# Patient Record
Sex: Female | Born: 1980 | Race: White | Hispanic: Yes | Marital: Married | Smoking: Never smoker
Health system: Southern US, Community
[De-identification: ages and names within clinical notes are randomized; demographics above are authoritative.]

## PROBLEM LIST (undated history)

## (undated) ENCOUNTER — Inpatient Hospital Stay (HOSPITAL_COMMUNITY): Payer: Self-pay

## (undated) DIAGNOSIS — B977 Papillomavirus as the cause of diseases classified elsewhere: Secondary | ICD-10-CM

## (undated) DIAGNOSIS — S6291XA Unspecified fracture of right wrist and hand, initial encounter for closed fracture: Secondary | ICD-10-CM

## (undated) DIAGNOSIS — B999 Unspecified infectious disease: Secondary | ICD-10-CM

## (undated) DIAGNOSIS — N200 Calculus of kidney: Secondary | ICD-10-CM

## (undated) DIAGNOSIS — D649 Anemia, unspecified: Secondary | ICD-10-CM

## (undated) DIAGNOSIS — IMO0002 Reserved for concepts with insufficient information to code with codable children: Secondary | ICD-10-CM

## (undated) DIAGNOSIS — R51 Headache: Secondary | ICD-10-CM

## (undated) HISTORY — PX: COLPOSCOPY: SHX161

## (undated) HISTORY — PX: CHOLECYSTECTOMY: SHX55

## (undated) HISTORY — DX: Headache: R51

## (undated) HISTORY — DX: Calculus of kidney: N20.0

## (undated) HISTORY — PX: ABDOMINOPLASTY: SUR9

## (undated) HISTORY — DX: Unspecified fracture of right wrist and hand, initial encounter for closed fracture: S62.91XA

---
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
## 2000-01-23 ENCOUNTER — Encounter: Payer: Self-pay | Admitting: *Deleted

## 2000-01-23 ENCOUNTER — Ambulatory Visit (HOSPITAL_COMMUNITY): Admission: RE | Admit: 2000-01-23 | Discharge: 2000-01-23 | Payer: Self-pay | Admitting: *Deleted

## 2000-04-07 ENCOUNTER — Inpatient Hospital Stay (HOSPITAL_COMMUNITY): Admission: AD | Admit: 2000-04-07 | Discharge: 2000-04-12 | Payer: Self-pay | Admitting: *Deleted

## 2000-04-07 ENCOUNTER — Encounter: Payer: Self-pay | Admitting: *Deleted

## 2000-04-07 ENCOUNTER — Encounter (INDEPENDENT_AMBULATORY_CARE_PROVIDER_SITE_OTHER): Payer: Self-pay

## 2000-04-08 ENCOUNTER — Encounter: Payer: Self-pay | Admitting: *Deleted

## 2000-04-08 ENCOUNTER — Encounter (HOSPITAL_BASED_OUTPATIENT_CLINIC_OR_DEPARTMENT_OTHER): Payer: Self-pay | Admitting: General Surgery

## 2000-06-19 ENCOUNTER — Encounter: Payer: Self-pay | Admitting: *Deleted

## 2000-06-19 ENCOUNTER — Observation Stay (HOSPITAL_COMMUNITY): Admission: AD | Admit: 2000-06-19 | Discharge: 2000-06-20 | Payer: Self-pay | Admitting: *Deleted

## 2000-07-28 ENCOUNTER — Observation Stay (HOSPITAL_COMMUNITY): Admission: AD | Admit: 2000-07-28 | Discharge: 2000-07-28 | Payer: Self-pay | Admitting: *Deleted

## 2000-07-31 ENCOUNTER — Inpatient Hospital Stay (HOSPITAL_COMMUNITY): Admission: AD | Admit: 2000-07-31 | Discharge: 2000-08-03 | Payer: Self-pay | Admitting: *Deleted

## 2004-04-24 ENCOUNTER — Other Ambulatory Visit: Admission: RE | Admit: 2004-04-24 | Discharge: 2004-04-24 | Payer: Self-pay | Admitting: Obstetrics and Gynecology

## 2004-10-16 ENCOUNTER — Inpatient Hospital Stay (HOSPITAL_COMMUNITY): Admission: AD | Admit: 2004-10-16 | Discharge: 2004-10-19 | Payer: Self-pay | Admitting: Obstetrics and Gynecology

## 2004-11-27 ENCOUNTER — Other Ambulatory Visit: Admission: RE | Admit: 2004-11-27 | Discharge: 2004-11-27 | Payer: Self-pay | Admitting: Obstetrics and Gynecology

## 2006-05-08 ENCOUNTER — Other Ambulatory Visit: Admission: RE | Admit: 2006-05-08 | Discharge: 2006-05-08 | Payer: Self-pay | Admitting: Obstetrics and Gynecology

## 2006-10-11 ENCOUNTER — Inpatient Hospital Stay (HOSPITAL_COMMUNITY): Admission: AD | Admit: 2006-10-11 | Discharge: 2006-10-11 | Payer: Self-pay | Admitting: Gynecology

## 2007-06-13 ENCOUNTER — Inpatient Hospital Stay (HOSPITAL_COMMUNITY): Admission: AD | Admit: 2007-06-13 | Discharge: 2007-06-13 | Payer: Self-pay | Admitting: Gynecology

## 2007-06-18 ENCOUNTER — Encounter (INDEPENDENT_AMBULATORY_CARE_PROVIDER_SITE_OTHER): Payer: Self-pay | Admitting: Obstetrics and Gynecology

## 2007-06-18 ENCOUNTER — Inpatient Hospital Stay (HOSPITAL_COMMUNITY): Admission: AD | Admit: 2007-06-18 | Discharge: 2007-06-18 | Payer: Self-pay | Admitting: Obstetrics and Gynecology

## 2007-11-26 DIAGNOSIS — N200 Calculus of kidney: Secondary | ICD-10-CM

## 2007-11-26 DIAGNOSIS — B999 Unspecified infectious disease: Secondary | ICD-10-CM

## 2007-11-26 HISTORY — DX: Unspecified infectious disease: B99.9

## 2007-11-26 HISTORY — DX: Calculus of kidney: N20.0

## 2008-07-06 ENCOUNTER — Emergency Department (HOSPITAL_COMMUNITY): Admission: EM | Admit: 2008-07-06 | Discharge: 2008-07-06 | Payer: Self-pay | Admitting: Emergency Medicine

## 2008-10-21 ENCOUNTER — Emergency Department (HOSPITAL_COMMUNITY): Admission: EM | Admit: 2008-10-21 | Discharge: 2008-10-21 | Payer: Self-pay | Admitting: Emergency Medicine

## 2009-07-14 ENCOUNTER — Emergency Department (HOSPITAL_COMMUNITY): Admission: EM | Admit: 2009-07-14 | Discharge: 2009-07-15 | Payer: Self-pay | Admitting: Emergency Medicine

## 2009-08-02 ENCOUNTER — Emergency Department (HOSPITAL_COMMUNITY): Admission: EM | Admit: 2009-08-02 | Discharge: 2009-08-02 | Payer: Self-pay | Admitting: Emergency Medicine

## 2010-11-25 DIAGNOSIS — S6291XA Unspecified fracture of right wrist and hand, initial encounter for closed fracture: Secondary | ICD-10-CM

## 2010-11-25 HISTORY — DX: Unspecified fracture of right wrist and hand, initial encounter for closed fracture: S62.91XA

## 2010-12-16 ENCOUNTER — Encounter: Payer: Self-pay | Admitting: Urology

## 2011-03-01 LAB — URINE MICROSCOPIC-ADD ON

## 2011-03-01 LAB — URINALYSIS, ROUTINE W REFLEX MICROSCOPIC
Glucose, UA: NEGATIVE mg/dL
Ketones, ur: NEGATIVE mg/dL
Protein, ur: 30 mg/dL — AB

## 2011-03-01 LAB — BASIC METABOLIC PANEL
BUN: 17 mg/dL (ref 6–23)
Chloride: 106 mEq/L (ref 96–112)
Glucose, Bld: 88 mg/dL (ref 70–99)
Potassium: 3.6 mEq/L (ref 3.5–5.1)

## 2011-03-01 LAB — CBC
HCT: 39.4 % (ref 36.0–46.0)
MCV: 87.1 fL (ref 78.0–100.0)
Platelets: 264 10*3/uL (ref 150–400)
WBC: 7.7 10*3/uL (ref 4.0–10.5)

## 2011-03-01 LAB — DIFFERENTIAL
Eosinophils Absolute: 0.1 10*3/uL (ref 0.0–0.7)
Eosinophils Relative: 1 % (ref 0–5)
Lymphs Abs: 1.8 10*3/uL (ref 0.7–4.0)

## 2011-03-01 LAB — POCT PREGNANCY, URINE: Preg Test, Ur: NEGATIVE

## 2011-03-01 LAB — URINE CULTURE

## 2011-03-02 LAB — POCT I-STAT, CHEM 8
BUN: 19 mg/dL (ref 6–23)
Creatinine, Ser: 1.1 mg/dL (ref 0.4–1.2)
Hemoglobin: 12.9 g/dL (ref 12.0–15.0)
Potassium: 3.8 mEq/L (ref 3.5–5.1)
Sodium: 140 mEq/L (ref 135–145)

## 2011-03-02 LAB — URINE MICROSCOPIC-ADD ON

## 2011-03-02 LAB — CBC
RBC: 4.22 MIL/uL (ref 3.87–5.11)
WBC: 10.6 10*3/uL — ABNORMAL HIGH (ref 4.0–10.5)

## 2011-03-02 LAB — DIFFERENTIAL
Lymphs Abs: 2.1 10*3/uL (ref 0.7–4.0)
Monocytes Relative: 5 % (ref 3–12)
Neutro Abs: 7.8 10*3/uL — ABNORMAL HIGH (ref 1.7–7.7)
Neutrophils Relative %: 74 % (ref 43–77)

## 2011-03-02 LAB — URINE CULTURE: Colony Count: NO GROWTH

## 2011-03-02 LAB — URINALYSIS, ROUTINE W REFLEX MICROSCOPIC
Glucose, UA: NEGATIVE mg/dL
Ketones, ur: NEGATIVE mg/dL
Protein, ur: NEGATIVE mg/dL

## 2011-04-12 NOTE — Discharge Summary (Signed)
[facility]  Patient:    [name] [name], [name] [name]                        MRN: [id number] Adm. Date:  04/07/00 Disc. Date: 04/12/00 Attending:  Deniece Ree, M.D.                           Discharge Summary  DISCHARGE DIAGNOSES:          1. Intrauterine pregnancy at approximately [redacted] weeks                                  gestation.                               2. Acute appendicitis.  PROCEDURE:                    Exploratory laparotomy with appendectomy.  HISTORY OF PRESENT ILLNESS:   The patient is an 30 year old primigravid approximately [redacted] weeks gestation who was admitted with premature labor.  The patients vital signs was within normal limits with a temperature of 98.4, pulse  102, respirations 24, and a blood pressure of 117/67.  The patients only complaint was severe abdominal pain which an ultrasound was done and only revealed that there was no cervix present.  On physical examination done by me, revealed that the cervix was indeed present and it was not dilated.  On further evaluation, it was felt that the patient had possibly appendicits and at which time Luisa Hart L. Lurene Shadow, M.D. was consulted who evaluated the patient and obtained further evaluation. After his consultation, it was felt that the patient possibly did have an appendicitis.  HOSPITAL COURSE:              She was operated on on May 27.  The patient underwent this surgical procedure without any problems.  She did not have any obstetrical  problems during this period of time and was discharged on May 19.  DISCHARGE INSTRUCTIONS:       She was instructed on the possible complications nd care following this type of surgery.  FOLLOW-UP:                    She was told to follow up with me for obstetrical  care and also with Luisa Hart L. Lurene Shadow, M.D. as they had planned. DD:  04/30/00 TD:  05/01/00 Job: [id number]

## 2011-04-12 NOTE — Op Note (Signed)
[facility]  Patient:    [name] [name], [name] [name]                         MRN: [id number] Proc. Date: 04/09/00 Adm. Date:  [id number] Attending:  Deniece Ree CC:         Mardene Celeste. Lurene Shadow, M.D. x 2             Deniece Ree, M.D.                           Operative Report  PREOPERATIVE DIAGNOSIS:       Appendicitis.  POSTOPERATIVE DIAGNOSIS:      Right lower quadrant pain, undetermined etiology.  OPERATION:                    Exploratory laparotomy and appendectomy.  SURGEON:                      Mardene Celeste. Lurene Shadow, M.D.  ASSISTANT:                    Marnee Spring. Wiliam Ke, M.D.  ANESTHESIA:                   General anesthesia.  ESTIMATED BLOOD LOSS:  INDICATIONS:                  The patient is an 30 year old female with 25 weeks gravid uterus who presented with nausea and vomiting and right-sided abdominal pain.  She had an ultrasound of the abdomen which showed no evidence of cholelithiasis.  Pelvic organs were obscured because of bowel gas.  She subsequently had a CT scan of the abdomen which showed retrocecal inflammation consistent with appendicitis.  We first tried treating her conservatively with antibiotics, but because of her persistent pain, she is brought to the operating room for exploration.  DESCRIPTION OF PROCEDURE:     Following the induction of anesthesia with the patient positioned supinely and the abdomen prepped and draped to be included in a sterile operative field, the right lower quadrant incision was deepened through the skin and subcutaneous tissues with splitting of the flank muscles in the direction of their fibers down to the peritoneum.  The peritoneum was grasped and entered. The cecum was mobilized.  The appendix appeared to be grossly normal.  The mesoappendix was divided between clamps and secured with ties of 2-0 silk.  The  base of the appendix was doubly ligated with 0 chromic and 2-0 silk.  The appendix was  then amputated and the appendiceal stump cauterized.  Additional exploration was carried out to make sure there were no cecal diverticula and there were none. The distal ileum was mobilized for approximately 25 inches showing no evidence f Meckels diverticula or any Crohns.  Palpation in the retroperitoneum did not reveal any inflammatory phlegmon whatsoever.  In order not to further disturb the uterus, the procedure was ended after the sponge, instrument, and sharp counts ere verified.  The peritoneum was closed with a running suture of 0 chromic catgut.  Internal oblique and external oblique muscles closed with 0 chromic sutures. The subcutaneous tissues were closed with 3-0 Vicryl.  The skin was closed with a 4-0 Monocryl.  The wound was reinforced with Steri-Strips and sterile dressings applied.  Anesthetic reversed and the patient removed from the operating room to the recovery room in stable condition  having tolerated the procedure well. DD:  04/09/00 TD:  04/10/00 Job: [id number]

## 2011-04-12 NOTE — Discharge Summary (Signed)
NAMEBRETTANY, [name] [name]              ACCOUNT NO.:  [id number]   MEDICAL RECORD NO.:  [id number]          PATIENT TYPE:  INP   LOCATION:  9117                          FACILITY:  [facility]   PHYSICIAN:  James A. Ashley Royalty, M.D.DATE OF BIRTH:  1980-12-28   DATE OF ADMISSION:  10/16/2004  DATE OF DISCHARGE:  10/19/2004                                 DISCHARGE SUMMARY   DISCHARGE DIAGNOSES:  1.  Intrauterine pregnancy at 39 weeks 4 days gestation, delivered.  2.  Premature rupture of membranes.  3.  Term birth living child.   OPERATIONS AND SPECIAL PROCEDURES:  OB delivery with episiotomy,  episiorrhaphy.   CONSULTATIONS:  None.   DISCHARGE MEDICATIONS:  Motrin.   HISTORY AND PHYSICAL:  This is a 31 year old gravida 2 para 1 at 39 weeks 4  days gestation.  Prenatal care was complicated only by prominent fetal  bladder on ultrasound.  She was evaluated at [facility] and all was felt  to be within normal limits.  The patient presented on the day of admission  complaining of ruptured membranes since 6 p.m.  Initial cervical examination  in maternity admissions revealed the cervix to be 3 cm dilated, quite thick,  -1 station, and vertex presentation.   HOSPITAL COURSE:  The patient was admitted to [facility]  [place].  Admission laboratory studies were drawn.  Pitocin induction was  begun on October 17, 2004.  The patient went on to deliver that day.  The  infant was a 7-pound 11-ounce female, Apgars 9 at one minute and 9 at five  minutes, sent to newborn nursery.  Delivery was accomplished by Dr. Lonell Face from a +3 station occiput anterior with a vacuum extractor.  The  indication was terminal bradycardia.  The placenta and membranes were  intact.  Delivery was accomplished over a midline episiotomy which was  repaired without difficulty.  The patient's postpartum course was benign.  She was discharged home on postpartum day #2 afebrile and in satisfactory   condition.   DISPOSITION:  The patient is to return to [facility]  in 4-6 weeks for postpartum evaluation.      JAM/MEDQ  D:  11/15/2004  T:  11/15/2004  Job:  [id number]

## 2011-08-23 LAB — URINALYSIS, ROUTINE W REFLEX MICROSCOPIC
Bilirubin Urine: NEGATIVE
Glucose, UA: NEGATIVE
Ketones, ur: NEGATIVE
Protein, ur: 30 — AB

## 2011-08-23 LAB — CBC
Hemoglobin: 11.4 — ABNORMAL LOW
MCHC: 33.7
RBC: 4
WBC: 6.2

## 2011-08-23 LAB — DIFFERENTIAL
Lymphocytes Relative: 18
Lymphs Abs: 1.1
Monocytes Absolute: 0.4
Monocytes Relative: 7
Neutro Abs: 4.7
Neutrophils Relative %: 75

## 2011-08-23 LAB — BASIC METABOLIC PANEL
CO2: 23
Calcium: 8.8
Creatinine, Ser: 0.67
GFR calc Af Amer: >60
GFR calc non Af Amer: >60
Sodium: 138

## 2011-08-23 LAB — URINE MICROSCOPIC-ADD ON

## 2011-08-23 LAB — PREGNANCY, URINE: Preg Test, Ur: NEGATIVE

## 2011-08-28 ENCOUNTER — Ambulatory Visit
Admission: RE | Admit: 2011-08-28 | Discharge: 2011-08-28 | Disposition: A | Discharge status: Home / Self Care | Payer: Self-pay | Source: Ambulatory Visit | Attending: Geriatric Medicine | Admitting: Geriatric Medicine

## 2011-08-28 ENCOUNTER — Other Ambulatory Visit: Payer: Self-pay | Admitting: Geriatric Medicine

## 2011-08-28 DIAGNOSIS — T1490XA Injury, unspecified, initial encounter: Secondary | ICD-10-CM

## 2011-09-09 LAB — POCT PREGNANCY, URINE
Operator id: 127531
Preg Test, Ur: POSITIVE

## 2011-09-09 LAB — CBC
HCT: 35.1 — ABNORMAL LOW
Hemoglobin: 11.9 — ABNORMAL LOW
MCHC: 33.3
MCV: 85.2
Platelets: 250
RBC: 4.12
RDW: 14.1 — ABNORMAL HIGH
WBC: 12.1 — ABNORMAL HIGH

## 2011-09-09 LAB — URINALYSIS, ROUTINE W REFLEX MICROSCOPIC
Bilirubin Urine: NEGATIVE
Glucose, UA: NEGATIVE
Specific Gravity, Urine: 1.025
Urobilinogen, UA: 0.2
pH: 6

## 2011-09-09 LAB — URINE MICROSCOPIC-ADD ON

## 2011-09-09 LAB — ABO/RH: ABO/RH(D): O POS

## 2011-09-09 LAB — GC/CHLAMYDIA PROBE AMP, GENITAL: GC Probe Amp, Genital: NEGATIVE

## 2011-09-09 LAB — WET PREP, GENITAL: Trich, Wet Prep: NONE SEEN

## 2011-09-09 LAB — HCG, QUANTITATIVE, PREGNANCY
hCG, Beta Chain, Quant, S: 6584 — ABNORMAL HIGH
hCG, Beta Chain, Quant, S: 901 — ABNORMAL HIGH

## 2011-11-26 NOTE — L&D Delivery Note (Signed)
Delivery Note   At 8:04 AM a viable female was delivered via Vaginal, Spontaneous Delivery (Presentation: Right Occiput Anterior).  Posterior arm delivered first, easy delivery of shoulders APGAR: 8, 9; weight .  Pending  Placenta status: Intact, Spontaneous.  Cord: 3 vessels with the following complications: None.  Cord pH: n/a  Anesthesia: Epidural  Episiotomy: None Lacerations: 1st degree;Perineal;Periurethral Suture Repair: 4-0 monocryl  Est. Blood Loss (mL): 200  Mom to postpartum.  Baby to nursery-stable. Pt plans brst/bottle, no circumcision  mirena for contraception   [name] [name] 09/22/2012, 8:48 AM

## 2012-02-12 ENCOUNTER — Inpatient Hospital Stay (HOSPITAL_COMMUNITY): Payer: Medicaid Other

## 2012-02-12 ENCOUNTER — Inpatient Hospital Stay (HOSPITAL_COMMUNITY)
Admission: AD | Admit: 2012-02-12 | Discharge: 2012-02-13 | Disposition: A | Discharge status: Home / Self Care | Payer: Medicaid Other | Source: Ambulatory Visit | Attending: Obstetrics and Gynecology | Admitting: Obstetrics and Gynecology

## 2012-02-12 ENCOUNTER — Encounter (HOSPITAL_COMMUNITY): Payer: Self-pay | Admitting: *Deleted

## 2012-02-12 DIAGNOSIS — O209 Hemorrhage in early pregnancy, unspecified: Secondary | ICD-10-CM | POA: Insufficient documentation

## 2012-02-12 DIAGNOSIS — O26859 Spotting complicating pregnancy, unspecified trimester: Secondary | ICD-10-CM

## 2012-02-12 DIAGNOSIS — O0930 Supervision of pregnancy with insufficient antenatal care, unspecified trimester: Secondary | ICD-10-CM | POA: Insufficient documentation

## 2012-02-12 HISTORY — DX: Papillomavirus as the cause of diseases classified elsewhere: B97.7

## 2012-02-12 HISTORY — DX: Reserved for concepts with insufficient information to code with codable children: IMO0002

## 2012-02-12 HISTORY — DX: Unspecified infectious disease: B99.9

## 2012-02-12 LAB — URINALYSIS, ROUTINE W REFLEX MICROSCOPIC
Bilirubin Urine: NEGATIVE
Glucose, UA: NEGATIVE mg/dL
Leukocytes, UA: NEGATIVE
Nitrite: NEGATIVE
Specific Gravity, Urine: 1.01 (ref 1.005–1.030)
pH: 6.5 (ref 5.0–8.0)

## 2012-02-12 LAB — URINE MICROSCOPIC-ADD ON

## 2012-02-12 NOTE — MAU Note (Signed)
Dr. Emelda Fear in to see pt.  Bedside U/S performed.

## 2012-02-12 NOTE — MAU Note (Signed)
Pt G4 P2, EDC 09/16/2012, +UPt at [facility].  Pt had one episode of small amt of vag bleeding on tissue tonight.  Denies pain.

## 2012-02-13 LAB — GC/CHLAMYDIA PROBE AMP, GENITAL: GC Probe Amp, Genital: NEGATIVE

## 2012-02-13 LAB — WET PREP, GENITAL

## 2012-02-13 LAB — ABO/RH: ABO/RH(D): O POS

## 2012-02-13 LAB — POCT PREGNANCY, URINE: Preg Test, Ur: POSITIVE — AB

## 2012-02-13 MED ORDER — PRENATAL VITAMINS 0.8 MG PO TABS: 1.0000 | ORAL_TABLET | Freq: Every day | ORAL | Status: DC

## 2012-02-13 MED ORDER — ONDANSETRON HCL 4 MG PO TABS: 4.0000 mg | ORAL_TABLET | Freq: Three times a day (TID) | ORAL | Status: AC | PRN

## 2012-02-13 NOTE — MAU Provider Note (Signed)
  History   31 yr g 4 P 2012 at 9 w 1d  With no PNC to date who experienced a single episode of bleeding per vagina today, no tissue or gush of fluid. Last coitus 3/16. Pt notes nausea of pregnancy, and breast tenderness. CSN: [id number]  Arrival date and time: 02/12/12 2212   None     Chief Complaint  Patient presents with  . Vaginal Bleeding   HPI  Pertinent Gynecological History: Bleeding: lite x 1 d  DES exposure: unknown Blood transfusions: none Sexually transmitted diseases: no past history Previous GYN Procedures: [facility]    Past Medical History  Diagnosis Date  . Infection   . Abnormal Pap smear   . HPV (human papilloma virus) infection     Past Surgical History  Procedure Date  . Colposcopy   . Appendectomy     Family History  Problem Relation Age of Onset  . Anesthesia problems Neg Hx     History  Substance Use Topics  . Smoking status: Never Smoker   . Smokeless tobacco: Never Used  . Alcohol Use: No    Allergies: No Known Allergies  Prescriptions prior to admission  Medication Sig Dispense Refill  . calcium gluconate 500 MG tablet Take 500 mg by mouth daily.      Marland Kitchen ibuprofen (ADVIL,MOTRIN) 200 MG tablet Take 400 mg by mouth every 6 (six) hours as needed. For pain or headache        ROS Physical Exam   Blood pressure 114/69, pulse 87, temperature 97.7 F (36.5 C), temperature source Oral, resp. rate 16, height 5\' 3"  (1.6 m), weight 59.603 kg (131 lb 6.4 oz), last menstrual period 12/11/2011.  Physical Exam Physical Examination: General appearance - alert, well appearing, and in no distress, oriented to person, place, and time, normal appearing weight and well hydrated Mental status - alert, oriented to person, place, and time, normal mood, behavior, speech, dress, motor activity, and thought processes Abdomen - soft, nontender, nondistended, no masses or organomegaly Pelvic - VULVA: normal appearing vulva with no masses, tenderness or lesions,  VAGINA: normal appearing vagina with normal color and discharge, no lesions, CERVIX: normal appearing cervix without discharge or lesions, WET MOUNT done - results: , DNA probe for chlamydia and GC obtained, cervix closed with no obvious blood, UTERUS: anteverted, enlarged to 4-6 week's size, mild uterine descensus ADNEXA: normal adnexa in size, nontender and no masses Bedside u/s is nondiagnostic, showing an iugs with yolk sac, but no identifiable fetal pole.  No subchorionic bleed noted. MAU Course  Proceduresultrasound shows IUP + fhm, 6w3day no internal bleeding near placenta  MDM   Assessment and Plan  Intrauterine pregnancy 1st trimester, size<<dates, with 1st tri bleeding. IUP 7w 3d Disp : pelvic rest x 1 wk  Roosevelt Bisher V 02/13/2012, 12:09 AM

## 2012-02-14 ENCOUNTER — Inpatient Hospital Stay (HOSPITAL_COMMUNITY)
Admission: AD | Admit: 2012-02-14 | Discharge: 2012-02-14 | Disposition: A | Discharge status: Home / Self Care | Payer: Medicaid Other | Source: Ambulatory Visit | Attending: Obstetrics and Gynecology | Admitting: Obstetrics and Gynecology

## 2012-02-14 DIAGNOSIS — O99891 Other specified diseases and conditions complicating pregnancy: Secondary | ICD-10-CM | POA: Insufficient documentation

## 2012-02-14 DIAGNOSIS — K0381 Cracked tooth: Secondary | ICD-10-CM

## 2012-02-14 DIAGNOSIS — K089 Disorder of teeth and supporting structures, unspecified: Secondary | ICD-10-CM | POA: Insufficient documentation

## 2012-02-14 DIAGNOSIS — S025XXA Fracture of tooth (traumatic), initial encounter for closed fracture: Secondary | ICD-10-CM | POA: Insufficient documentation

## 2012-02-14 DIAGNOSIS — Z3201 Encounter for pregnancy test, result positive: Secondary | ICD-10-CM

## 2012-02-14 MED ORDER — HYDROCODONE-ACETAMINOPHEN 5-500 MG PO TABS: 1.0000 | ORAL_TABLET | Freq: Four times a day (QID) | ORAL | Status: AC | PRN

## 2012-02-14 MED ORDER — AMOXICILLIN 500 MG PO CAPS: 500.0000 mg | ORAL_CAPSULE | Freq: Three times a day (TID) | ORAL | Status: AC

## 2012-02-14 NOTE — MAU Provider Note (Signed)
  History   Pt presents today c/o tooth pain. She states she has fractured her tooth and her dentist is requiring a note stating she can have dental work before they will see her. She denies fever, abd pain, vag dc, bleeding, or any other problems. She states she only needs a letter stating she can have dental work.  CSN: [id number]  Arrival date and time: 02/14/12 1712   None     No chief complaint on file.  HPI  OB History    Grav Para Term Preterm Abortions TAB SAB Ect Mult Living   4 2   1  1   2       Past Medical History  Diagnosis Date  . Infection   . Abnormal Pap smear   . HPV (human papilloma virus) infection     Past Surgical History  Procedure Date  . Colposcopy   . Appendectomy     Family History  Problem Relation Age of Onset  . Anesthesia problems Neg Hx     History  Substance Use Topics  . Smoking status: Never Smoker   . Smokeless tobacco: Never Used  . Alcohol Use: No    Allergies: No Known Allergies  No prescriptions prior to admission    Review of Systems  Constitutional: Negative for fever and chills.  Eyes: Negative for blurred vision and double vision.  Respiratory: Negative for cough, hemoptysis, sputum production, shortness of breath and wheezing.   Cardiovascular: Negative for chest pain and palpitations.  Gastrointestinal: Negative for nausea, vomiting, abdominal pain, diarrhea and constipation.  Genitourinary: Negative for dysuria, urgency, frequency and hematuria.  Neurological: Negative for dizziness and headaches.  Psychiatric/Behavioral: Negative for depression and suicidal ideas.   Physical Exam   Blood pressure 119/69, pulse 92, temperature 97.2 F (36.2 C), temperature source Oral, resp. rate 16, last menstrual period 12/11/2011.  Physical Exam  Nursing note and vitals reviewed. Constitutional: She is oriented to person, place, and time. She appears well-developed and well-nourished. No distress.  HENT:  Head:  Normocephalic and atraumatic.  Mouth/Throat: Uvula is midline. No oropharyngeal exudate, posterior oropharyngeal edema, posterior oropharyngeal erythema or tonsillar abscesses.    Eyes: EOM are normal. Pupils are equal, round, and reactive to light.  GI: Soft. She exhibits no distension and no mass. There is no tenderness. There is no rebound and no guarding.  Neurological: She is alert and oriented to person, place, and time.  Skin: Skin is warm and dry. She is not diaphoretic.  Psychiatric: She has a normal mood and affect. Her behavior is normal. Judgment and thought content normal.    MAU Course  Procedures    Assessment and Plan  Dental fracture: discussed with pt at length. Gave pt letter describing the kind of dental care she may have during pregnancy. Also gave Rx for amoxicillin and vicodin. Discussed diet, activity, risks, and precautions.  Clinton Gallant. Rigby Leonhardt III, DrHSc, MPAS, PA-C  02/14/2012, 5:49 PM

## 2012-02-14 NOTE — MAU Note (Signed)
Patient has toothache since last Thursday called a dentist he needs a note saying it is okay to do dental work. 7 weeks

## 2012-02-14 NOTE — Discharge Instructions (Signed)
Dental Care and Dentist Visits   Dental care supports good overall health. Regular dental visits can also help you avoid dental pain, bleeding, infection, and other more serious health problems in the future. It is important to keep the mouth healthy because diseases in the teeth, gums, and other oral tissues can spread to other areas of the body. Some problems, such as diabetes, heart disease, and pre-term labor have been associated with poor oral health.   See your dentist every 6 months. If you experience emergency problems such as a toothache or broken tooth, go to the dentist right away. If you see your dentist regularly, you may catch problems early. It is easier to be treated for problems in the early stages.   WHAT TO EXPECT AT A DENTIST VISIT   Your dentist will look for many common oral health problems and recommend proper treatment. At your regular dental visit, you can expect:   Gentle cleaning of the teeth and gums. This includes scraping and polishing. This helps to remove the sticky substance around the teeth and gums (plaque). Plaque forms in the mouth shortly after eating. Over time, plaque hardens on the teeth as tartar. If tartar is not removed regularly, it can cause problems. Cleaning also helps remove stains.   Periodic X-rays. These pictures of the teeth and supporting bone will help your dentist assess the health of your teeth.   Periodic fluoride treatments. Fluoride is a natural mineral shown to help strengthen teeth. Fluoride treatment involves applying a fluoride gel or varnish to the teeth. It is most commonly done in children.   Examination of the mouth, tongue, jaws, teeth, and gums to look for any oral health problems, such as:   Cavities (dental caries). This is decay on the tooth caused by plaque, sugar, and acid in the mouth. It is best to catch a cavity when it is small.   Inflammation of the gums caused by plaque buildup (gingivitis).   Problems with the mouth or malformed or  misaligned teeth.   Oral cancer or other diseases of the soft tissues or jaws.   KEEP YOUR TEETH AND GUMS HEALTHY   For healthy teeth and gums, follow these general guidelines as well as your dentist's specific advice:   Have your teeth professionally cleaned at the dentist every 6 months.   Brush twice daily with a fluoride toothpaste.   Floss your teeth daily.   Ask your dentist if you need fluoride supplements, treatments, or fluoride toothpaste.   Eat a healthy diet. Reduce foods and drinks with added sugar.   Avoid smoking.  TREATMENT FOR ORAL HEALTH PROBLEMS   If you have oral health problems, treatment varies depending on the conditions present in your teeth and gums.   Your caregiver will most likely recommend good oral hygiene at each visit.   For cavities, gingivitis, or other oral health disease, your caregiver will perform a procedure to treat the problem. This is typically done at a separate appointment. Sometimes your caregiver will refer you to another dental specialist for specific tooth problems or for surgery.  SEEK IMMEDIATE DENTAL CARE IF:   You have pain, bleeding, or soreness in the gum, tooth, jaw, or mouth area.   A permanent tooth becomes loose or separated from the gum socket.   You experience a blow or injury to the mouth or jaw area.  Document Released: 07/24/2011 Document Revised: 10/31/2011 Document Reviewed: 07/24/2011   [employer] Patient Information 2012 [employer]

## 2012-02-15 NOTE — MAU Provider Note (Signed)
Agree with above note.  [name] [name] 02/15/2012 7:18 AM

## 2012-04-02 ENCOUNTER — Ambulatory Visit (INDEPENDENT_AMBULATORY_CARE_PROVIDER_SITE_OTHER): Payer: Medicaid Other | Admitting: Obstetrics and Gynecology

## 2012-04-02 DIAGNOSIS — Z331 Pregnant state, incidental: Secondary | ICD-10-CM

## 2012-04-02 LAB — POCT URINALYSIS DIPSTICK
Bilirubin, UA: NEGATIVE
Nitrite, UA: NEGATIVE
Urobilinogen, UA: NEGATIVE

## 2012-04-02 NOTE — Progress Notes (Signed)
DENTAL X-RAY X2; WITH DOUBLE SHIELDS.  HAD ROOT CANAL 04/02/12

## 2012-04-03 LAB — PRENATAL PANEL VII
Basophils Absolute: 0 10*3/uL (ref 0.0–0.1)
HIV: NONREACTIVE
Hepatitis B Surface Ag: NEGATIVE
Lymphocytes Relative: 15 % (ref 12–46)
Neutro Abs: 9.7 10*3/uL — ABNORMAL HIGH (ref 1.7–7.7)
Platelets: 280 10*3/uL (ref 150–400)
RDW: 14.4 % (ref 11.5–15.5)
WBC: 12.3 10*3/uL — ABNORMAL HIGH (ref 4.0–10.5)

## 2012-04-06 LAB — HEMOGLOBINOPATHY EVALUATION
Hemoglobin Other: 0 %
Hgb A2 Quant: 2.7 % (ref 2.2–3.2)
Hgb A: 97.3 % (ref 96.8–97.8)
Hgb S Quant: 0 %

## 2012-04-21 ENCOUNTER — Encounter: Payer: Self-pay | Admitting: Obstetrics and Gynecology

## 2012-04-21 ENCOUNTER — Ambulatory Visit (INDEPENDENT_AMBULATORY_CARE_PROVIDER_SITE_OTHER): Payer: Medicaid Other | Admitting: Obstetrics and Gynecology

## 2012-04-21 VITALS — BP 90/60 | Wt 128.0 lb

## 2012-04-21 DIAGNOSIS — N200 Calculus of kidney: Secondary | ICD-10-CM | POA: Insufficient documentation

## 2012-04-21 DIAGNOSIS — Z124 Encounter for screening for malignant neoplasm of cervix: Secondary | ICD-10-CM

## 2012-04-21 DIAGNOSIS — R51 Headache: Secondary | ICD-10-CM

## 2012-04-21 DIAGNOSIS — O0930 Supervision of pregnancy with insufficient antenatal care, unspecified trimester: Secondary | ICD-10-CM

## 2012-04-21 DIAGNOSIS — B9689 Other specified bacterial agents as the cause of diseases classified elsewhere: Secondary | ICD-10-CM | POA: Insufficient documentation

## 2012-04-21 DIAGNOSIS — A499 Bacterial infection, unspecified: Secondary | ICD-10-CM

## 2012-04-21 DIAGNOSIS — B977 Papillomavirus as the cause of diseases classified elsewhere: Secondary | ICD-10-CM

## 2012-04-21 DIAGNOSIS — Z331 Pregnant state, incidental: Secondary | ICD-10-CM

## 2012-04-21 DIAGNOSIS — N760 Acute vaginitis: Secondary | ICD-10-CM

## 2012-04-21 DIAGNOSIS — Z3400 Encounter for supervision of normal first pregnancy, unspecified trimester: Secondary | ICD-10-CM

## 2012-04-21 DIAGNOSIS — N926 Irregular menstruation, unspecified: Secondary | ICD-10-CM

## 2012-04-21 DIAGNOSIS — R519 Headache, unspecified: Secondary | ICD-10-CM | POA: Insufficient documentation

## 2012-04-21 LAB — POCT WET PREP (WET MOUNT)
Trichomonas Wet Prep HPF POC: NEGATIVE
pH: 5.5

## 2012-04-21 NOTE — Patient Instructions (Signed)
ABCs of Pregnancy A Antepartum care is very important. Be sure you see your doctor and get prenatal care as soon as you think you are pregnant. At this time, you will be tested for infection, genetic abnormalities and potential problems with you and the pregnancy. This is the time to discuss diet, exercise, work, medications, labor, pain medication during labor and the possibility of a cesarean delivery. Ask any questions that may concern you. It is important to see your doctor regularly throughout your pregnancy. Avoid exposure to toxic substances and chemicals - such as cleaning solvents, lead and mercury, some insecticides, and paint. Pregnant women should avoid exposure to paint fumes, and fumes that cause you to feel ill, dizzy or faint. When possible, it is a good idea to have a pre-pregnancy consultation with your caregiver to begin some important recommendations your caregiver suggests such as, taking folic acid, exercising, quitting smoking, avoiding alcoholic beverages, etc. B Breastfeeding is the healthiest choice for both you and your baby. It has many nutritional benefits for the baby and health benefits for the mother. It also creates a very tight and loving bond between the baby and mother. Talk to your doctor, your family and friends, and your employer about how you choose to feed your baby and how they can support you in your decision. Not all birth defects can be prevented, but a woman can take actions that may increase her chance of having a healthy baby. Many birth defects happen very early in pregnancy, sometimes before a woman even knows she is pregnant. Birth defects or abnormalities of any child in your or the father's family should be discussed with your caregiver. Get a good support bra as your breast size changes. Wear it especially when you exercise and when nursing.  C Celebrate the news of your pregnancy with the your spouse/father and family. Childbirth classes are helpful to  take for you and the spouse/father because it helps to understand what happens during the pregnancy, labor and delivery. Cesarean delivery should be discussed with your doctor so you are prepared for that possibility. The pros and cons of circumcision if it is a boy, should be discussed with your pediatrician. Cigarette smoking during pregnancy can result in low birth weight babies. It has been associated with infertility, miscarriages, tubal pregnancies, infant death (mortality) and poor health (morbidity) in childhood. Additionally, cigarette smoking may cause long-term learning disabilities. If you smoke, you should try to quit before getting pregnant and not smoke during the pregnancy. Secondary smoke may also harm a mother and her developing baby. It is a good idea to ask people to stop smoking around you during your pregnancy and after the baby is born. Extra calcium is necessary when you are pregnant and is found in your prenatal vitamin, in dairy products, green leafy vegetables and in calcium supplements. D A healthy diet according to your current weight and height, along with vitamins and mineral supplements should be discussed with your caregiver. Domestic abuse or violence should be made known to your doctor right away to get the situation corrected. Drink more water when you exercise to keep hydrated. Discomfort of your back and legs usually develops and progresses from the middle of the second trimester through to delivery of the baby. This is because of the enlarging baby and uterus, which may also affect your balance. Do not take illegal drugs. Illegal drugs can seriously harm the baby and you. Drink extra fluids (water is best) throughout pregnancy to help   your body keep up with the increases in your blood volume. Drink at least 6 to 8 glasses of water, fruit juice, or milk each day. A good way to know you are drinking enough fluid is when your urine looks almost like clear water or is very light  yellow.  E Eat healthy to get the nutrients you and your unborn baby need. Your meals should include the five basic food groups. Exercise (30 minutes of light to moderate exercise a day) is important and encouraged during pregnancy, if there are no medical problems or problems with the pregnancy. Exercise that causes discomfort or dizziness should be stopped and reported to your caregiver. Emotions during pregnancy can change from being ecstatic to depression and should be understood by you, your partner and your family. F Fetal screening with ultrasound, amniocentesis and monitoring during pregnancy and labor is common and sometimes necessary. Take 400 micrograms of folic acid daily both before, when possible, and during the first few months of pregnancy to reduce the risk of birth defects of the brain and spine. All women who could possibly become pregnant should take a vitamin with folic acid, every day. It is also important to eat a healthy diet with fortified foods (enriched grain products, including cereals, rice, breads, and pastas) and foods with natural sources of folate (orange juice, green leafy vegetables, beans, peanuts, broccoli, asparagus, peas, and lentils). The father should be involved with all aspects of the pregnancy including, the prenatal care, childbirth classes, labor, delivery, and postpartum time. Fathers may also have emotional concerns about being a father, financial needs, and raising a family. G Genetic testing should be done appropriately. It is important to know your family and the father's history. If there have been problems with pregnancies or birth defects in your family, report these to your doctor. Also, genetic counselors can talk with you about the information you might need in making decisions about having a family. You can call a major medical center in your area for help in finding a board-certified genetic counselor. Genetic testing and counseling should be done  before pregnancy when possible, especially if there is a history of problems in the mother's or father's family. Certain ethnic backgrounds are more at risk for genetic defects. H Get familiar with the hospital where you will be having your baby. Get to know how long it takes to get there, the labor and delivery area, and the hospital procedures. Be sure your medical insurance is accepted there. Get your home ready for the baby including, clothes, the baby's room (when possible), furniture and car seat. Hand washing is important throughout the day, especially after handling raw meat and poultry, changing the baby's diaper or using the bathroom. This can help prevent the spread of many bacteria and viruses that cause infection. Your hair may become dry and thinner, but will return to normal a few weeks after the baby is born. Heartburn is a common problem that can be treated by taking antacids recommended by your caregiver, eating smaller meals 5 or 6 times a day, not drinking liquids when eating, drinking between meals and raising the head of your bed 2 to 3 inches. I Insurance to cover you, the baby, doctor and hospital should be reviewed so that you will be prepared to pay any costs not covered by your insurance plan. If you do not have medical insurance, there are usually clinics and services available for you in your community. Take 30 milligrams of iron during   your pregnancy as prescribed by your doctor to reduce the risk of low red blood cells (anemia) later in pregnancy. All women of childbearing age should eat a diet rich in iron. J There should be a joint effort for the mother, father and any other children to adapt to the pregnancy financially, emotionally, and psychologically during the pregnancy. Join a support group for moms-to-be. Or, join a class on parenting or childbirth. Have the family participate when possible. K Know your limits. Let your caregiver know if you experience any of the  following:   Pain of any kind.   Strong cramps.   You develop a lot of weight in a short period of time (5 pounds in 3 to 5 days).   Vaginal bleeding, leaking of amniotic fluid.   Headache, vision problems.   Dizziness, fainting, shortness of breath.   Chest pain.   Fever of 102 F (38.9 C) or higher.   Gush of clear fluid from your vagina.   Painful urination.   Domestic violence.   Irregular heartbeat (palpitations).   Rapid beating of the heart (tachycardia).   Constant feeling sick to your stomach (nauseous) and vomiting.   Trouble walking, fluid retention (edema).   Muscle weakness.   If your baby has decreased activity.   Persistent diarrhea.   Abnormal vaginal discharge.   Uterine contractions at 20-minute intervals.   Back pain that travels down your leg.  L Learn and practice that what you eat and drink should be in moderation and healthy for you and your baby. Legal drugs such as alcohol and caffeine are important issues for pregnant women. There is no safe amount of alcohol a woman can drink while pregnant. Fetal alcohol syndrome, a disorder characterized by growth retardation, facial abnormalities, and central nervous system dysfunction, is caused by a woman's use of alcohol during pregnancy. Caffeine, found in tea, coffee, soft drinks and chocolate, should also be limited. Be sure to read labels when trying to cut down on caffeine during pregnancy. More than 200 foods, beverages, and over-the-counter medications contain caffeine and have a high salt content! There are coffees and teas that do not contain caffeine. M Medical conditions such as diabetes, epilepsy, and high blood pressure should be treated and kept under control before pregnancy when possible, but especially during pregnancy. Ask your caregiver about any medications that may need to be changed or adjusted during pregnancy. If you are currently taking any medications, ask your caregiver if it  is safe to take them while you are pregnant or before getting pregnant when possible. Also, be sure to discuss any herbs or vitamins you are taking. They are medicines, too! Discuss with your doctor all medications, prescribed and over-the-counter, that you are taking. During your prenatal visit, discuss the medications your doctor may give you during labor and delivery. N Never be afraid to ask your doctor or caregiver questions about your health, the progress of the pregnancy, family problems, stressful situations, and recommendation for a pediatrician, if you do not have one. It is better to take all precautions and discuss any questions or concerns you may have during your office visits. It is a good idea to write down your questions before you visit the doctor. O Over-the-counter cough and cold remedies may contain alcohol or other ingredients that should be avoided during pregnancy. Ask your caregiver about prescription, herbs or over-the-counter medications that you are taking or may consider taking while pregnant.  P Physical activity during pregnancy can   benefit both you and your baby by lessening discomfort and fatigue, providing a sense of well-being, and increasing the likelihood of early recovery after delivery. Light to moderate exercise during pregnancy strengthens the belly (abdominal) and back muscles. This helps improve posture. Practicing yoga, walking, swimming, and cycling on a stationary bicycle are usually safe exercises for pregnant women. Avoid scuba diving, exercise at high altitudes (over 3000 feet), skiing, horseback riding, contact sports, etc. Always check with your doctor before beginning any kind of exercise, especially during pregnancy and especially if you did not exercise before getting pregnant. Q Queasiness, stomach upset and morning sickness are common during pregnancy. Eating a couple of crackers or dry toast before getting out of bed. Foods that you normally love may  make you feel sick to your stomach. You may need to substitute other nutritious foods. Eating 5 or 6 small meals a day instead of 3 large ones may make you feel better. Do not drink with your meals, drink between meals. Questions that you have should be written down and asked during your prenatal visits. R Read about and make plans to baby-proof your home. There are important tips for making your home a safer environment for your baby. Review the tips and make your home safer for you and your baby. Read food labels regarding calories, salt and fat content in the food. S Saunas, hot tubs, and steam rooms should be avoided while you are pregnant. Excessive high heat may be harmful during your pregnancy. Your caregiver will screen and examine you for sexually transmitted diseases and genetic disorders during your prenatal visits. Learn the signs of labor. Sexual relations while pregnant is safe unless there is a medical or pregnancy problem and your caregiver advises against it. T Traveling long distances should be avoided especially in the third trimester of your pregnancy. If you do have to travel out of state, be sure to take a copy of your medical records and medical insurance plan with you. You should not travel long distances without seeing your doctor first. Most airlines will not allow you to travel after 36 weeks of pregnancy. Toxoplasmosis is an infection caused by a parasite that can seriously harm an unborn baby. Avoid eating undercooked meat and handling cat litter. Be sure to wear gloves when gardening. Tingling of the hands and fingers is not unusual and is due to fluid retention. This will go away after the baby is born. U Womb (uterus) size increases during the first trimester. Your kidneys will begin to function more efficiently. This may cause you to feel the need to urinate more often. You may also leak urine when sneezing, coughing or laughing. This is due to the growing uterus pressing  against your bladder, which lies directly in front of and slightly under the uterus during the first few months of pregnancy. If you experience burning along with frequency of urination or bloody urine, be sure to tell your doctor. The size of your uterus in the third trimester may cause a problem with your balance. It is advisable to maintain good posture and avoid wearing high heels during this time. An ultrasound of your baby may be necessary during your pregnancy and is safe for you and your baby. V Vaccinations are an important concern for pregnant women. Get needed vaccines before pregnancy. Center for Disease Control (www.cdc.gov) has clear guidelines for the use of vaccines during pregnancy. Review the list, be sure to discuss it with your doctor. Prenatal vitamins are helpful   and healthy for you and the baby. Do not take extra vitamins except what is recommended. Taking too much of certain vitamins can cause overdose problems. Continuous vomiting should be reported to your caregiver. Varicose veins may appear especially if there is a family history of varicose veins. They should subside after the delivery of the baby. Support hose helps if there is leg discomfort. W Being overweight or underweight during pregnancy may cause problems. Try to get within 15 pounds of your ideal weight before pregnancy. Remember, pregnancy is not a time to be dieting! Do not stop eating or start skipping meals as your weight increases. Both you and your baby need the calories and nutrition you receive from a healthy diet. Be sure to consult with your doctor about your diet. There is a formula and diet plan available depending on whether you are overweight or underweight. Your caregiver or nutritionist can help and advise you if necessary. X Avoid X-rays. If you must have dental work or diagnostic tests, tell your dentist or physician that you are pregnant so that extra care can be taken. X-rays should only be taken when  the risks of not taking them outweigh the risk of taking them. If needed, only the minimum amount of radiation should be used. When X-rays are necessary, protective lead shields should be used to cover areas of the body that are not being X-rayed. Y Your baby loves you. Breastfeeding your baby creates a loving and very close bond between the two of you. Give your baby a healthy environment to live in while you are pregnant. Infants and children require constant care and guidance. Their health and safety should be carefully watched at all times. After the baby is born, rest or take a nap when the baby is sleeping. Z Get your ZZZs. Be sure to get plenty of rest. Resting on your side as often as possible, especially on your left side is advised. It provides the best circulation to your baby and helps reduce swelling. Try taking a nap for 30 to 45 minutes in the afternoon when possible. After the baby is born rest or take a nap when the baby is sleeping. Try elevating your feet for that amount of time when possible. It helps the circulation in your legs and helps reduce swelling.  Most information courtesy of the CDC. Document Released: 11/11/2005 Document Revised: 10/31/2011 Document Reviewed: 07/26/2009 [employer] Patient Information 2012 [employer]. 

## 2012-04-21 NOTE — Progress Notes (Signed)
Pt wants genetic screenings  

## 2012-04-24 LAB — PAP IG, CT-NG, RFX HPV ASCU
Chlamydia Probe Amp: NEGATIVE
GC Probe Amp: NEGATIVE

## 2012-04-30 ENCOUNTER — Telehealth: Payer: Self-pay

## 2012-04-30 DIAGNOSIS — N926 Irregular menstruation, unspecified: Secondary | ICD-10-CM | POA: Insufficient documentation

## 2012-04-30 DIAGNOSIS — O0930 Supervision of pregnancy with insufficient antenatal care, unspecified trimester: Secondary | ICD-10-CM | POA: Insufficient documentation

## 2012-04-30 NOTE — Progress Notes (Addendum)
Subjective:    [name] [name] is being seen today for her first obstetrical visit.  This is not a planned pregnancy. She is at [redacted]w[redacted]d gestation. She has not had routine prenatal care, tho she has been seen in [facility] and had an [place] at 7w that established EDC of 09/27/12. 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 Relationship with FOB: spouse, living together. Patient does intend to breast feed. Pregnancy history fully reviewed.  Patient reports as reported in ROS.  Review of Systems:   Review of Systems  HENT: Positive for dental problem.        Has been seeing dentist, had root canal and was placed on ABX, but has not finished them, has f/u appt next week  Eyes: Positive for visual disturbance.       Has had blurry vision, has appt for eye dr  Gastrointestinal: Positive for nausea and vomiting.       Improving, vomiting a couple x/week, has taken medicine unsure of the name   Neurological: Positive for headaches.       Had daily before preg and w prev preg, does not take medication for it, has not had further evaluation for it   All other systems reviewed and are negative.    Objective:     BP 90/60  Wt 128 lb (58.06 kg)  LMP 12/11/2011 Physical Exam  Nursing note and vitals reviewed. Constitutional: She is oriented to person, place, and time. She appears well-developed and well-nourished. No distress.  HENT:  Head: Normocephalic and atraumatic.  Mouth/Throat: Oropharynx is clear and moist.  Eyes: Pupils are equal, round, and reactive to light.  Neck: Normal range of motion. No thyromegaly present.  Cardiovascular: Normal rate, regular rhythm and normal heart sounds.   Respiratory: Effort normal and breath sounds normal.  GI: Soft. Bowel sounds are normal.       gravid  Genitourinary: Vagina normal. No vaginal discharge found.       cx cl/th/high Wet prep neg Pap/GC/CT collected  Musculoskeletal:  Normal range of motion. She exhibits no edema and no tenderness.  Neurological: She is alert and oriented to person, place, and time. She has normal reflexes.  Skin: Skin is warm and dry.  Psychiatric: She has a normal mood and affect. Her behavior is normal.    Maternal Exam:  Abdomen: Patient reports no abdominal tenderness. Fundal height is 17.    Introitus: Normal vulva. Normal vagina.  Vagina is negative for discharge.  Pelvis: adequate for delivery.   Cervix: Cervix evaluated by sterile speculum exam and digital exam.     Fetal Exam Fetal Monitor Review: Mode: hand-held doppler probe.   Baseline rate: 156.         Assessment:    Pregnancy: [id number] Patient Active Problem List  Diagnoses  . HPV (human papilloma virus) infection  . Pregnant state, incidental  . Kidney stone - 2009  . Headache - frequent  . Bacterial vaginosis - 1st trimester  . Late prenatal care - 17wks  . Menses, irregular  unknown LMP     Plan:     Initial labs Rv'd, bl type O pos, urine cx neg, sickle cell neg,  Prenatal vitamins. Problem list reviewed and updated. Pt desires Quad Screen  Pap/GC/CT today Wet prep today - neg Pt declines antiemetics - rv'd diet and exercise and healthy habits, enc f/u w dentist as scheduled rv'd ok to take tylenol/motrin  for HA - motrin ok until 28weeks  Rv'd normal routines - [name] care, when to call office, etc  Plan Anatomy US and Quad screen [place] in 2 weeks  Pt [facility], will request records   Tieshia Rettinger M 04/30/2012

## 2012-05-01 ENCOUNTER — Other Ambulatory Visit: Payer: Self-pay

## 2012-05-01 DIAGNOSIS — Z331 Pregnant state, incidental: Secondary | ICD-10-CM

## 2012-05-06 NOTE — Telephone Encounter (Signed)
Spoke with patient informing her we are attempting to collect pap smear records from the health dept; however, it is required that she signs a release of information either at the health dept or here. Pt states she will attempt to get the records from the health dept today and bring them with her to her appt tomorrow.

## 2012-05-07 ENCOUNTER — Other Ambulatory Visit: Payer: Medicaid Other

## 2012-05-07 ENCOUNTER — Encounter: Payer: Medicaid Other | Admitting: Obstetrics and Gynecology

## 2012-05-07 ENCOUNTER — Ambulatory Visit (INDEPENDENT_AMBULATORY_CARE_PROVIDER_SITE_OTHER): Payer: Medicaid Other

## 2012-05-07 ENCOUNTER — Ambulatory Visit (INDEPENDENT_AMBULATORY_CARE_PROVIDER_SITE_OTHER): Payer: Medicaid Other | Admitting: Obstetrics and Gynecology

## 2012-05-07 VITALS — BP 100/60 | Wt 127.0 lb

## 2012-05-07 DIAGNOSIS — Z3689 Encounter for other specified antenatal screening: Secondary | ICD-10-CM

## 2012-05-07 DIAGNOSIS — Z331 Pregnant state, incidental: Secondary | ICD-10-CM

## 2012-05-07 MED ORDER — PROMETHAZINE HCL 12.5 MG PO TABS: 12.5000 mg | ORAL_TABLET | Freq: Four times a day (QID) | ORAL | Status: DC | PRN

## 2012-05-07 NOTE — Progress Notes (Signed)
Complains of no appetite.  Still some nausea and vomiting. Phenergan 12.5 mg every 6 hours as needed for nausea.  Nutrition discussed. Ultrasound: Single gestation, normal fluid, normal anatomy, female, cervix 3.25 cm, 19 weeks and 1 day (45th percentile). Patient states this is her last pregnancy.  Signed tubal papers next visit. Return to office in 4 weeks. Dr. Stefano Gaul

## 2012-05-08 ENCOUNTER — Telehealth: Payer: Self-pay | Admitting: Obstetrics and Gynecology

## 2012-05-08 NOTE — Telephone Encounter (Signed)
Left msg on pt's voice mail to call back rgd genetic screening per sl from visit from 04/21/2012 bt cma

## 2012-05-08 NOTE — Telephone Encounter (Signed)
Spoke with pt rgd genetic screening that was never done in may. Per pt declines genetic testing . Pt voice understanding . bt cma

## 2012-05-13 LAB — US OB COMP + 14 WK

## 2012-05-13 NOTE — Telephone Encounter (Signed)
Spoke with pt regarding records. Pt states she wasn't able to get records and bring to appt on 05/07/12 due to moving into a new house. Pt wants [facility] now signing release of info form. Pt states they will fax records today.

## 2012-06-04 ENCOUNTER — Ambulatory Visit (INDEPENDENT_AMBULATORY_CARE_PROVIDER_SITE_OTHER): Payer: Medicaid Other | Admitting: Obstetrics and Gynecology

## 2012-06-04 VITALS — BP 100/60 | Wt 130.0 lb

## 2012-06-04 DIAGNOSIS — Z331 Pregnant state, incidental: Secondary | ICD-10-CM

## 2012-06-04 NOTE — Progress Notes (Signed)
Pt . Stated been having a lot of heartburn and advised pt to drink 8-10 glasses of water a day. Pt stated no other issues .

## 2012-06-04 NOTE — Progress Notes (Signed)
C/O GERD:Precautions reviewed, suggest Zantac

## 2012-07-02 ENCOUNTER — Ambulatory Visit (INDEPENDENT_AMBULATORY_CARE_PROVIDER_SITE_OTHER): Payer: Medicaid Other | Admitting: Obstetrics and Gynecology

## 2012-07-02 VITALS — BP 98/56 | Wt 134.0 lb

## 2012-07-02 DIAGNOSIS — B977 Papillomavirus as the cause of diseases classified elsewhere: Secondary | ICD-10-CM

## 2012-07-02 DIAGNOSIS — Z331 Pregnant state, incidental: Secondary | ICD-10-CM

## 2012-07-02 LAB — CBC
HCT: 33.8 % — ABNORMAL LOW (ref 36.0–46.0)
Hemoglobin: 11.6 g/dL — ABNORMAL LOW (ref 12.0–15.0)
MCV: 85.4 fL (ref 78.0–100.0)
Platelets: 280 10*3/uL (ref 150–400)
RBC: 3.96 MIL/uL (ref 3.87–5.11)
WBC: 11 10*3/uL — ABNORMAL HIGH (ref 4.0–10.5)

## 2012-07-02 NOTE — Progress Notes (Signed)
Pt. Stated no issues today / glucola given @  11:33 am draw @ 12: 33 pm

## 2012-07-02 NOTE — Progress Notes (Signed)
[redacted]w[redacted]d Glucola today

## 2012-07-14 ENCOUNTER — Ambulatory Visit (INDEPENDENT_AMBULATORY_CARE_PROVIDER_SITE_OTHER): Payer: Medicaid Other | Admitting: Obstetrics and Gynecology

## 2012-07-14 VITALS — BP 96/60 | Wt 136.0 lb

## 2012-07-14 DIAGNOSIS — Z331 Pregnant state, incidental: Secondary | ICD-10-CM

## 2012-07-14 NOTE — Progress Notes (Signed)
[redacted]w[redacted]d Normal Glucola and Hgb 11.6

## 2012-07-14 NOTE — Progress Notes (Signed)
C/O : feet hurting and back pain.

## 2012-07-15 ENCOUNTER — Encounter (HOSPITAL_COMMUNITY): Payer: Self-pay

## 2012-07-15 ENCOUNTER — Inpatient Hospital Stay (HOSPITAL_COMMUNITY)
Admission: AD | Admit: 2012-07-15 | Discharge: 2012-07-15 | DRG: 781 | Disposition: A | Discharge status: Home / Self Care | Payer: Medicaid Other | Source: Ambulatory Visit | Attending: Obstetrics and Gynecology | Admitting: Obstetrics and Gynecology

## 2012-07-15 DIAGNOSIS — R51 Headache: Secondary | ICD-10-CM

## 2012-07-15 DIAGNOSIS — O0930 Supervision of pregnancy with insufficient antenatal care, unspecified trimester: Secondary | ICD-10-CM

## 2012-07-15 DIAGNOSIS — R109 Unspecified abdominal pain: Secondary | ICD-10-CM | POA: Diagnosis present

## 2012-07-15 DIAGNOSIS — Z331 Pregnant state, incidental: Secondary | ICD-10-CM

## 2012-07-15 DIAGNOSIS — B977 Papillomavirus as the cause of diseases classified elsewhere: Secondary | ICD-10-CM

## 2012-07-15 DIAGNOSIS — N200 Calculus of kidney: Secondary | ICD-10-CM

## 2012-07-15 DIAGNOSIS — N926 Irregular menstruation, unspecified: Secondary | ICD-10-CM

## 2012-07-15 DIAGNOSIS — O26839 Pregnancy related renal disease, unspecified trimester: Secondary | ICD-10-CM

## 2012-07-15 DIAGNOSIS — O99891 Other specified diseases and conditions complicating pregnancy: Principal | ICD-10-CM

## 2012-07-15 DIAGNOSIS — O4700 False labor before 37 completed weeks of gestation, unspecified trimester: Secondary | ICD-10-CM | POA: Diagnosis present

## 2012-07-15 DIAGNOSIS — B9689 Other specified bacterial agents as the cause of diseases classified elsewhere: Secondary | ICD-10-CM

## 2012-07-15 LAB — COMPREHENSIVE METABOLIC PANEL
AST: 17 U/L (ref 0–37)
CO2: 20 mEq/L (ref 19–32)
Chloride: 104 mEq/L (ref 96–112)
Creatinine, Ser: 0.48 mg/dL — ABNORMAL LOW (ref 0.50–1.10)
GFR calc Af Amer: >90 mL/min (ref >90)
GFR calc non Af Amer: >90 mL/min (ref >90)
Glucose, Bld: 94 mg/dL (ref 70–99)
Total Bilirubin: 0.2 mg/dL — ABNORMAL LOW (ref 0.3–1.2)

## 2012-07-15 LAB — URINALYSIS, ROUTINE W REFLEX MICROSCOPIC
Bilirubin Urine: NEGATIVE
Nitrite: NEGATIVE
Protein, ur: NEGATIVE mg/dL
Specific Gravity, Urine: 1.015 (ref 1.005–1.030)
Urobilinogen, UA: 0.2 mg/dL (ref 0.0–1.0)
Urobilinogen, UA: 0.2 mg/dL (ref 0.0–1.0)
pH: 6 (ref 5.0–8.0)

## 2012-07-15 LAB — CBC WITH DIFFERENTIAL/PLATELET
Basophils Absolute: 0 10*3/uL (ref 0.0–0.1)
Eosinophils Absolute: 0.1 10*3/uL (ref 0.0–0.7)
Eosinophils Relative: 1 % (ref 0–5)
HCT: 34.8 % — ABNORMAL LOW (ref 36.0–46.0)
Hemoglobin: 11.4 g/dL — ABNORMAL LOW (ref 12.0–15.0)
Lymphocytes Relative: 17 % (ref 12–46)
Lymphs Abs: 2 10*3/uL (ref 0.7–4.0)
MCHC: 32.8 g/dL (ref 30.0–36.0)
MCV: 88.8 fL (ref 78.0–100.0)
Monocytes Absolute: 1 10*3/uL (ref 0.1–1.0)
Monocytes Relative: 8 % (ref 3–12)
Neutro Abs: 9 10*3/uL — ABNORMAL HIGH (ref 1.7–7.7)
RBC: 3.92 MIL/uL (ref 3.87–5.11)
RDW: 14 % (ref 11.5–15.5)
WBC: 12.1 10*3/uL — ABNORMAL HIGH (ref 4.0–10.5)

## 2012-07-15 LAB — WET PREP, GENITAL
Clue Cells Wet Prep HPF POC: NONE SEEN
Trich, Wet Prep: NONE SEEN
Yeast Wet Prep HPF POC: NONE SEEN

## 2012-07-15 LAB — URINE MICROSCOPIC-ADD ON

## 2012-07-15 MED ORDER — HYDROCODONE-ACETAMINOPHEN 5-500 MG PO TABS: 1.0000 | ORAL_TABLET | Freq: Four times a day (QID) | ORAL | Status: AC | PRN

## 2012-07-15 MED ORDER — CYCLOBENZAPRINE HCL 10 MG PO TABS
10.0000 mg | ORAL_TABLET | Freq: Once | ORAL | Status: AC
  Administered 2012-07-15: 10 mg via ORAL

## 2012-07-15 MED ORDER — ZOLPIDEM TARTRATE 5 MG PO TABS: 5.0000 mg | ORAL_TABLET | Freq: Every evening | ORAL | Status: DC | PRN

## 2012-07-15 MED ORDER — PRENATAL MULTIVITAMIN CH
1.0000 | ORAL_TABLET | Freq: Every day | ORAL | Status: DC
  Filled 2012-07-15: qty 1

## 2012-07-15 MED ORDER — NITROFURANTOIN MONOHYD MACRO 100 MG PO CAPS: 100.0000 mg | ORAL_CAPSULE | Freq: Two times a day (BID) | ORAL | Status: AC

## 2012-07-15 MED ORDER — LACTATED RINGERS IV SOLN
INTRAVENOUS | Status: DC
  Administered 2012-07-15 (×3): via INTRAVENOUS

## 2012-07-15 MED ORDER — ACETAMINOPHEN 325 MG PO TABS: 650.0000 mg | ORAL_TABLET | ORAL | Status: DC | PRN

## 2012-07-15 MED ORDER — CALCIUM CARBONATE ANTACID 500 MG PO CHEW: 2.0000 | CHEWABLE_TABLET | ORAL | Status: DC | PRN

## 2012-07-15 MED ORDER — DOCUSATE SODIUM 100 MG PO CAPS
100.0000 mg | ORAL_CAPSULE | Freq: Every day | ORAL | Status: DC
  Filled 2012-07-15: qty 1

## 2012-07-15 MED ORDER — CYCLOBENZAPRINE HCL 10 MG PO TABS: 10.0000 mg | ORAL_TABLET | Freq: Three times a day (TID) | ORAL | Status: AC | PRN

## 2012-07-15 MED ORDER — BUTORPHANOL TARTRATE 1 MG/ML IJ SOLN
2.0000 mg | INTRAMUSCULAR | Status: DC | PRN
  Administered 2012-07-15: 1 mg via INTRAVENOUS
  Filled 2012-07-15: qty 1

## 2012-07-15 NOTE — H&P (Signed)
states she starting feeling contraction pain at 11pm, present now with uc's. Breathing through uc's     Denies srom or vag bleeding, no N,V,D, or constipation, with bm +FM, had bm x 2 last one at 2300   [name] [name] is a 31 y.o. [id number] at [redacted]w[redacted]d "  6/01 SVE 40 7#8 Vac 11/0 SVD 40 7#15 2008 SAB Current uncomplicated Patient Active Problem List   Diagnosis   .  HPV (human papilloma virus) infection   .  Pregnant state, incidental   .  Kidney stone - 2009   .  Headache - frequent   .  Bacterial vaginosis - 1st trimester   .  Late prenatal care - 17wks   .  Menses, irregular    Chief Complaint   Patient presents with   .  Contractions    @SFHPI @  Vitals: Blood pressure 106/68, pulse 90, temperature 97.5 F (36.4 C), temperature source Oral, resp. rate 18, last menstrual period 12/11/2011.  OB History    Grav  Para  Term  Preterm  Abortions  TAB  SAB  Ect  Mult  Living    4  2  2   1   1    2      SVD x 2 1 week of bedrest with delivery at term  Past Medical History   Diagnosis  Date   .  HPV (human papilloma virus) infection    .  Abnormal Pap smear  2000     COLPO X2; LAST PAP 2012   .  Infection  2009     UTI   .  Kidney stones  2009   .  Headache      FREQUENT   .  Fracture of right hand  2012    Past Surgical History   Procedure  Date   .  Colposcopy    .  Appendectomy  2001     DURING PREGNANCY    Family History   Problem  Relation  Age of Onset   .  Anesthesia problems  Neg Hx    .  Depression  Son  9      AFTER DEATH OF FATHER    .  COPD  Paternal Grandfather     History   Substance Use Topics   .  Smoking status:  Never Smoker   .  Smokeless tobacco:  Never Used   .  Alcohol Use:  No      OCCASIONAL MIXED DRINK    Allergies: No Known Allergies  Prescriptions prior to admission   Medication  Sig  Dispense  Refill   .  amoxicillin (AMOXIL) 500 MG capsule  Take 500 mg by mouth 4 (four) times daily. POST ROOT CANAL     .  calcium gluconate  500 MG tablet  Take 500 mg by mouth daily.     .  Prenatal Multivit-Min-Fe-FA (PRENATAL VITAMINS) 0.8 MG tablet  Take 1 tablet by mouth daily.  30 tablet  12   .  promethazine (PHENERGAN) 12.5 MG tablet  Take 1 tablet (12.5 mg total) by mouth every 6 (six) hours as needed for nausea.  30 tablet  2    @ROS @  Physical Exam   Blood pressure 106/68, pulse 90, temperature 97.5 F (36.4 C), temperature source Oral, resp. rate 18, last menstrual period 12/11/2011.  @PHYSEXAMBYAGE2 @  Labs:  No results found for this or any previous visit (from the past 24 hour(s)).  Imaging Studies:  Medications: Scheduled  Medication List  As of 07/15/2012 3:13 AM    ASK your doctor about these medications          amoxicillin 500 MG capsule      Commonly known as: AMOXIL      Take 500 mg by mouth 4 (four) times daily. POST ROOT CANAL      calcium gluconate 500 MG tablet      Take 500 mg by mouth daily.      PRENATAL VITAMINS 0.8 MG tablet      Take 1 tablet by mouth daily.      promethazine 12.5 MG tablet      Commonly known as: PHENERGAN      Take 1 tablet (12.5 mg total) by mouth every 6 (six) hours as needed for nausea.           I have reviewed the patient's current medications.  ASSESSMENT:  Patient Active Problem List   Diagnosis   .  HPV (human papilloma virus) infection   .  Pregnant state, incidental   .  Kidney stone - 2009   .  Headache - frequent   .  Bacterial vaginosis - 1st trimester   .  Late prenatal care - 17wks   .  Menses, irregular    Physical Examination:  General appearance - alert, well appearing, and in no distress  Physical exam: Calm, no distress, HEENT wnl lungs clear bilaterally, AP RRR, abd soft, gravid, nt, bowel sounds hypoactive, Fundal height 29 cm  Normal hair distrubition mons pubis,  EGBUS WNL, sterile speculum exam,  vagina pink, moist normal rugae,  cerix LTC, no cervical motion tenderness  sm amount white discharge  No edema to lower extremities    Fhts categroy one  uc q 2-3 to irregular mild  ED Course   Assessment/Plan  [redacted]w[redacted]d  P FFN, GC/CHL, GBS, wet prep, po hydration, ua pending  Lavera Guise, [name]  Addendum;  Results for orders placed during the hospital encounter of 07/15/12 (from the past 24 hour(s))   FETAL FIBRONECTIN Status: Normal    Collection Time    07/15/12 3:05 AM   Component  Value  Range    Fetal Fibronectin  NEGATIVE  NEGATIVE   WET PREP, GENITAL Status: Abnormal    Collection Time    07/15/12 3:05 AM   Component  Value  Range    Yeast Wet Prep HPF POC  NONE SEEN  NONE SEEN    Trich, Wet Prep  NONE SEEN  NONE SEEN    Clue Cells Wet Prep HPF POC  NONE SEEN  NONE SEEN    WBC, Wet Prep HPF POC  FEW (*)  NONE SEEN   URINALYSIS, ROUTINE W REFLEX MICROSCOPIC Status: Abnormal    Collection Time    07/15/12 3:30 AM   Component  Value  Range    Color, Urine  YELLOW  YELLOW    APPearance  HAZY (*)  CLEAR    Specific Gravity, Urine  1.015  1.005 - 1.030    pH  6.0  5.0 - 8.0    Glucose, UA  NEGATIVE  NEGATIVE mg/dL    Hgb urine dipstick  LARGE (*)  NEGATIVE    Bilirubin Urine  NEGATIVE  NEGATIVE    Ketones, ur  NEGATIVE  NEGATIVE mg/dL    Protein, ur  NEGATIVE  NEGATIVE mg/dL    Urobilinogen, UA  0.2  0.0 - 1.0 mg/dL    Nitrite  NEGATIVE  NEGATIVE    Leukocytes, UA  NEGATIVE  NEGATIVE   URINE MICROSCOPIC-ADD ON Status: Normal    Collection Time    07/15/12 3:30 AM   Component  Value  Range    Squamous Epithelial / LPF  RARE  RARE    WBC, UA  0-2  <3 WBC/hpf    RBC / HPF  TOO NUMEROUS TO COUNT  <3 RBC/hpf   O pos  HIV neg RPR NR Hep B neg Rubella immune 1 gtt 108  A/P [redacted]w[redacted]d probable kidney stone  IV fluid, strain urine, stadol, CBC, CMP, collaboratation with Dr. Stefano Gaul per telephone.

## 2012-07-15 NOTE — MAU Note (Signed)
Patient states she starting feeling contraction pain at 11pm, present now with uc's. Breathing through uc's

## 2012-07-15 NOTE — Progress Notes (Addendum)
Hospital day # 0 pregnancy at [redacted]w[redacted]d  S: well, reports good fetal activity      Contractions:none, very sporadic      Vaginal bleeding:none now but did have some after vaginal exam (Which was before clean catch U/A)       Vaginal discharge: no significant change No pain meds since 6:00 am. Reports that pain is suprapubic and 3/10  O: BP 96/58  Pulse 83  Temp 98.4 F (36.9 C) (Oral)  Resp 20  Ht 5\' 2"  (1.575 m)  Wt 136 lb (61.689 kg)  BMI 24.87 kg/m2  LMP 12/11/2011      Fetal tracings:reviewed and reassuring      Uterus gravid and non-tender      No CVAT      Extremities: no significant edema and no signs of DVT  A: [redacted]w[redacted]d with pelvic pain of unknown etiology Musculosquelettal?     Stable  P: repeat U/A     Possible D/C home today  Jaylenn Baiza A  MD 07/15/2012 12:52 PM   U/A repeated: 7-10 RBC, rare bacteria Possible kidney stone with mild residual pain D/C home with pain management Follow-up in 1 week  Urine culture pending

## 2012-07-15 NOTE — Progress Notes (Signed)
Ur chart review completed.  

## 2012-07-15 NOTE — Progress Notes (Signed)
History Patient states she starting feeling contraction pain at 11pm, present now with uc's. Breathing through uc's     Denies srom or vag bleeding, no N,V,D, or constipation, with bm +FM, had bm x 2 last one at 2300   [name] [name] is a 31 y.o. [id number] at [redacted]w[redacted]d  " Patient Active Problem List  Diagnosis  . HPV (human papilloma virus) infection  . Pregnant state, incidental  . Kidney stone - 2009  . Headache - frequent  . Bacterial vaginosis - 1st trimester  . Late prenatal care - 17wks  . Menses, irregular    Chief Complaint  Patient presents with  . Contractions   @SFHPI @  Vitals:  Blood pressure 106/68, pulse 90, temperature 97.5 F (36.4 C), temperature source Oral, resp. rate 18, last menstrual period 12/11/2011. OB History    Grav Para Term Preterm Abortions TAB SAB Ect Mult Living   4 2 2  1  1   2     SVD x 2 1 week of bedrest with delivery at term  Past Medical History  Diagnosis Date  . HPV (human papilloma virus) infection   . Abnormal Pap smear 2000    COLPO X2; LAST PAP 2012  . Infection 2009    UTI   . Kidney stones 2009  . Headache     FREQUENT  . Fracture of right hand 2012    Past Surgical History  Procedure Date  . Colposcopy   . Appendectomy 2001    DURING PREGNANCY    Family History  Problem Relation Age of Onset  . Anesthesia problems Neg Hx   . Depression Son 9    AFTER DEATH OF FATHER  . COPD Paternal Grandfather     History  Substance Use Topics  . Smoking status: Never Smoker   . Smokeless tobacco: Never Used  . Alcohol Use: No     OCCASIONAL MIXED DRINK    Allergies: No Known Allergies  Prescriptions prior to admission  Medication Sig Dispense Refill  . amoxicillin (AMOXIL) 500 MG capsule Take 500 mg by mouth 4 (four) times daily. POST ROOT CANAL      . calcium gluconate 500 MG tablet Take 500 mg by mouth daily.      . Prenatal Multivit-Min-Fe-FA (PRENATAL VITAMINS) 0.8 MG tablet Take 1 tablet by mouth daily.   30 tablet  12  . promethazine (PHENERGAN) 12.5 MG tablet Take 1 tablet (12.5 mg total) by mouth every 6 (six) hours as needed for nausea.  30 tablet  2    @ROS @ Physical Exam   Blood pressure 106/68, pulse 90, temperature 97.5 F (36.4 C), temperature source Oral, resp. rate 18, last menstrual period 12/11/2011.  @PHYSEXAMBYAGE2 @ Labs:  No results found for this or any previous visit (from the past 24 hour(s)).  Imaging Studies:      Medications:  Scheduled  Medication List  As of 07/15/2012  3:13 AM   ASK your doctor about these medications         amoxicillin 500 MG capsule   Commonly known as: AMOXIL   Take 500 mg by mouth 4 (four) times daily. POST ROOT CANAL      calcium gluconate 500 MG tablet   Take 500 mg by mouth daily.      PRENATAL VITAMINS 0.8 MG tablet   Take 1 tablet by mouth daily.      promethazine 12.5 MG tablet   Commonly known as: PHENERGAN   Take 1 tablet (12.5  mg total) by mouth every 6 (six) hours as needed for nausea.              I have reviewed the patient's current medications.  ASSESSMENT: Patient Active Problem List  Diagnosis  . HPV (human papilloma virus) infection  . Pregnant state, incidental  . Kidney stone - 2009  . Headache - frequent  . Bacterial vaginosis - 1st trimester  . Late prenatal care - 17wks  . Menses, irregular   Physical Examination:  General appearance - alert, well appearing, and in no distress Physical exam: Calm, no distress, HEENT wnl lungs clear bilaterally, AP RRR, abd soft, gravid, nt, bowel sounds hypoactive, Fundal height 29 cm Normal hair distrubition mons pubis,  EGBUS WNL, sterile speculum exam,  vagina pink, moist normal rugae,  cerix LTC, no cervical motion tenderness sm amount white discharge No edema to lower extremities Fhts categroy one uc q 2-3 to irregular mild ED Course  Assessment/Plan [redacted]w[redacted]d P FFN, GC/CHL, GBS, wet prep, po hydration, ua pending [name] [name],  [name] Addendum; Results for orders placed during the hospital encounter of 07/15/12 (from the past 24 hour(s))  FETAL FIBRONECTIN     Status: Normal   Collection Time   07/15/12  3:05 AM      Component Value Range   Fetal Fibronectin NEGATIVE  NEGATIVE  WET PREP, GENITAL     Status: Abnormal   Collection Time   07/15/12  3:05 AM      Component Value Range   Yeast Wet Prep HPF POC NONE SEEN  NONE SEEN   Trich, Wet Prep NONE SEEN  NONE SEEN   Clue Cells Wet Prep HPF POC NONE SEEN  NONE SEEN   WBC, Wet Prep HPF POC FEW (*) NONE SEEN  URINALYSIS, ROUTINE W REFLEX MICROSCOPIC     Status: Abnormal   Collection Time   07/15/12  3:30 AM      Component Value Range   Color, Urine YELLOW  YELLOW   APPearance HAZY (*) CLEAR   Specific Gravity, Urine 1.015  1.005 - 1.030   pH 6.0  5.0 - 8.0   Glucose, UA NEGATIVE  NEGATIVE mg/dL   Hgb urine dipstick LARGE (*) NEGATIVE   Bilirubin Urine NEGATIVE  NEGATIVE   Ketones, ur NEGATIVE  NEGATIVE mg/dL   Protein, ur NEGATIVE  NEGATIVE mg/dL   Urobilinogen, UA 0.2  0.0 - 1.0 mg/dL   Nitrite NEGATIVE  NEGATIVE   Leukocytes, UA NEGATIVE  NEGATIVE  URINE MICROSCOPIC-ADD ON     Status: Normal   Collection Time   07/15/12  3:30 AM      Component Value Range   Squamous Epithelial / LPF RARE  RARE   WBC, UA 0-2  <3 WBC/hpf   RBC / HPF TOO NUMEROUS TO COUNT  <3 RBC/hpf  A/P [redacted]w[redacted]d probable kidney stone        IV fluid, strain urine, stadol, CBC, CMP, collaboratation with Dr. Stefano Gaul per telephone.

## 2012-07-15 NOTE — Discharge Summary (Signed)
  ANTENATAL DISCHARGE SUMMARY  Patient ID: [name] [name] MRN: [id number] DOB/AGE: 02-15-1981 30 y.o.  Admit date: 07/15/2012 Discharge date: 07/15/2012  Admission Diagnoses: 29 WKS, ABD PAIN of unknown etiology. Possible urolithiasis.  Discharge Diagnoses: same         Discharged Condition: good  Hospital Course: Less than 24 hours of observation, IV hydration and pain management. Urine analysis positive for blood. Urine culture pending.  Consults: None  Treatments: see hospital course  Disposition: home   Medication List  As of 07/15/2012  2:33 PM   STOP taking these medications         acetaminophen 325 MG tablet         TAKE these medications         calcium carbonate 500 MG chewable tablet   Commonly known as: TUMS - dosed in mg elemental calcium   Chew 1 tablet by mouth once.      cyclobenzaprine 10 MG tablet   Commonly known as: FLEXERIL   Take 1 tablet (10 mg total) by mouth 3 (three) times daily as needed for muscle spasms.      HYDROcodone-acetaminophen 5-500 MG per tablet   Commonly known as: VICODIN   Take 1 tablet by mouth every 6 (six) hours as needed for pain.      nitrofurantoin (macrocrystal-monohydrate) 100 MG capsule   Commonly known as: MACROBID   Take 1 capsule (100 mg total) by mouth 2 (two) times daily.      PRENATAL VITAMINS 0.8 MG tablet   Take 1 tablet by mouth daily.      promethazine 12.5 MG tablet   Commonly known as: PHENERGAN   Take 1 tablet (12.5 mg total) by mouth every 6 (six) hours as needed for nausea.             Signed: Esmeralda Arthur, MD MD 07/15/2012, 2:33 PM

## 2012-07-15 NOTE — MAU Note (Signed)
Pt ambulated to the bathroom, monitors off for now

## 2012-07-16 LAB — URINE CULTURE: Colony Count: 15000

## 2012-07-16 LAB — GC/CHLAMYDIA PROBE AMP, GENITAL: Chlamydia, DNA Probe: NEGATIVE

## 2012-07-17 LAB — CULTURE, BETA STREP (GROUP B ONLY)

## 2012-07-23 ENCOUNTER — Ambulatory Visit (INDEPENDENT_AMBULATORY_CARE_PROVIDER_SITE_OTHER): Payer: Medicaid Other | Admitting: Obstetrics and Gynecology

## 2012-07-23 ENCOUNTER — Encounter: Payer: Self-pay | Admitting: Obstetrics and Gynecology

## 2012-07-23 VITALS — BP 104/60 | Wt 140.0 lb

## 2012-07-23 DIAGNOSIS — Z3480 Encounter for supervision of other normal pregnancy, unspecified trimester: Secondary | ICD-10-CM

## 2012-07-23 NOTE — Progress Notes (Signed)
Pt c/o pressure and back pain.  

## 2012-07-23 NOTE — Progress Notes (Signed)
Pt without c/o FKC reviewed 

## 2012-07-23 NOTE — Patient Instructions (Signed)
Fetal Movement Counts Patient Name: __________________________________________________ Patient Due Date: ____________________ Kick counts is highly recommended in high risk pregnancies, but it is a good idea for every pregnant woman to do. Start counting fetal movements at 28 weeks of the pregnancy. Fetal movements increase after eating a full meal or eating or drinking something sweet (the blood sugar is higher). It is also important to drink plenty of fluids (well hydrated) before doing the count. Lie on your left side because it helps with the circulation or you can sit in a comfortable chair with your arms over your belly (abdomen) with no distractions around you. DOING THE COUNT  Try to do the count the same time of day each time you do it.   Mark the day and time, then see how long it takes for you to feel 10 movements (kicks, flutters, swishes, rolls). You should have at least 10 movements within 2 hours. You will most likely feel 10 movements in much less than 2 hours. If you do not, wait an hour and count again. After a couple of days you will see a pattern.   What you are looking for is a change in the pattern or not enough counts in 2 hours. Is it taking longer in time to reach 10 movements?  SEEK MEDICAL CARE IF:  You feel less than 10 counts in 2 hours. Tried twice.   No movement in one hour.   The pattern is changing or taking longer each day to reach 10 counts in 2 hours.   You feel the baby is not moving as it usually does.  Date: ____________ Movements: ____________ Start time: ____________ Finish time: ____________  Date: ____________ Movements: ____________ Start time: ____________ Finish time: ____________ Date: ____________ Movements: ____________ Start time: ____________ Finish time: ____________ Date: ____________ Movements: ____________ Start time: ____________ Finish time: ____________ Date: ____________ Movements: ____________ Start time: ____________ Finish time:  ____________ Date: ____________ Movements: ____________ Start time: ____________ Finish time: ____________ Date: ____________ Movements: ____________ Start time: ____________ Finish time: ____________ Date: ____________ Movements: ____________ Start time: ____________ Finish time: ____________  Date: ____________ Movements: ____________ Start time: ____________ Finish time: ____________ Date: ____________ Movements: ____________ Start time: ____________ Finish time: ____________ Date: ____________ Movements: ____________ Start time: ____________ Finish time: ____________ Date: ____________ Movements: ____________ Start time: ____________ Finish time: ____________ Date: ____________ Movements: ____________ Start time: ____________ Finish time: ____________ Date: ____________ Movements: ____________ Start time: ____________ Finish time: ____________ Date: ____________ Movements: ____________ Start time: ____________ Finish time: ____________  Date: ____________ Movements: ____________ Start time: ____________ Finish time: ____________ Date: ____________ Movements: ____________ Start time: ____________ Finish time: ____________ Date: ____________ Movements: ____________ Start time: ____________ Finish time: ____________ Date: ____________ Movements: ____________ Start time: ____________ Finish time: ____________ Date: ____________ Movements: ____________ Start time: ____________ Finish time: ____________ Date: ____________ Movements: ____________ Start time: ____________ Finish time: ____________ Date: ____________ Movements: ____________ Start time: ____________ Finish time: ____________  Date: ____________ Movements: ____________ Start time: ____________ Finish time: ____________ Date: ____________ Movements: ____________ Start time: ____________ Finish time: ____________ Date: ____________ Movements: ____________ Start time: ____________ Finish time: ____________ Date: ____________ Movements:  ____________ Start time: ____________ Finish time: ____________ Date: ____________ Movements: ____________ Start time: ____________ Finish time: ____________ Date: ____________ Movements: ____________ Start time: ____________ Finish time: ____________ Date: ____________ Movements: ____________ Start time: ____________ Finish time: ____________  Date: ____________ Movements: ____________ Start time: ____________ Finish time: ____________ Date: ____________ Movements: ____________ Start time: ____________ Finish time: ____________ Date: ____________ Movements: ____________ Start time:   ____________ Finish time: ____________ Date: ____________ Movements: ____________ Start time: ____________ Finish time: ____________ Date: ____________ Movements: ____________ Start time: ____________ Finish time: ____________ Date: ____________ Movements: ____________ Start time: ____________ Finish time: ____________ Date: ____________ Movements: ____________ Start time: ____________ Finish time: ____________  Date: ____________ Movements: ____________ Start time: ____________ Finish time: ____________ Date: ____________ Movements: ____________ Start time: ____________ Finish time: ____________ Date: ____________ Movements: ____________ Start time: ____________ Finish time: ____________ Date: ____________ Movements: ____________ Start time: ____________ Finish time: ____________ Date: ____________ Movements: ____________ Start time: ____________ Finish time: ____________ Date: ____________ Movements: ____________ Start time: ____________ Finish time: ____________ Date: ____________ Movements: ____________ Start time: ____________ Finish time: ____________  Date: ____________ Movements: ____________ Start time: ____________ Finish time: ____________ Date: ____________ Movements: ____________ Start time: ____________ Finish time: ____________ Date: ____________ Movements: ____________ Start time: ____________ Finish  time: ____________ Date: ____________ Movements: ____________ Start time: ____________ Finish time: ____________ Date: ____________ Movements: ____________ Start time: ____________ Finish time: ____________ Date: ____________ Movements: ____________ Start time: ____________ Finish time: ____________ Date: ____________ Movements: ____________ Start time: ____________ Finish time: ____________  Date: ____________ Movements: ____________ Start time: ____________ Finish time: ____________ Date: ____________ Movements: ____________ Start time: ____________ Finish time: ____________ Date: ____________ Movements: ____________ Start time: ____________ Finish time: ____________ Date: ____________ Movements: ____________ Start time: ____________ Finish time: ____________ Date: ____________ Movements: ____________ Start time: ____________ Finish time: ____________ Date: ____________ Movements: ____________ Start time: ____________ Finish time: ____________ Document Released: 12/11/2006 Document Revised: 10/31/2011 Document Reviewed: 06/13/2009 [employer] Patient Information 2012 [employer] 

## 2012-07-30 ENCOUNTER — Encounter: Payer: Medicaid Other | Admitting: Obstetrics and Gynecology

## 2012-08-07 ENCOUNTER — Encounter: Payer: Self-pay | Admitting: Obstetrics and Gynecology

## 2012-08-07 ENCOUNTER — Ambulatory Visit (INDEPENDENT_AMBULATORY_CARE_PROVIDER_SITE_OTHER): Payer: Medicaid Other | Admitting: Obstetrics and Gynecology

## 2012-08-07 VITALS — BP 100/58 | Wt 141.0 lb

## 2012-08-07 DIAGNOSIS — Z331 Pregnant state, incidental: Secondary | ICD-10-CM

## 2012-08-07 NOTE — Progress Notes (Signed)
Pt sometimes feels pain in upper abdomen area.

## 2012-08-07 NOTE — Patient Instructions (Signed)
Fetal Movement Counts Patient Name: __________________________________________________ Patient Due Date: ____________________ Kick counts is highly recommended in high risk pregnancies, but it is a good idea for every pregnant woman to do. Start counting fetal movements at 28 weeks of the pregnancy. Fetal movements increase after eating a full meal or eating or drinking something sweet (the blood sugar is higher). It is also important to drink plenty of fluids (well hydrated) before doing the count. Lie on your left side because it helps with the circulation or you can sit in a comfortable chair with your arms over your belly (abdomen) with no distractions around you. DOING THE COUNT  Try to do the count the same time of day each time you do it.   Mark the day and time, then see how long it takes for you to feel 10 movements (kicks, flutters, swishes, rolls). You should have at least 10 movements within 2 hours. You will most likely feel 10 movements in much less than 2 hours. If you do not, wait an hour and count again. After a couple of days you will see a pattern.   What you are looking for is a change in the pattern or not enough counts in 2 hours. Is it taking longer in time to reach 10 movements?  SEEK MEDICAL CARE IF:  You feel less than 10 counts in 2 hours. Tried twice.   No movement in one hour.   The pattern is changing or taking longer each day to reach 10 counts in 2 hours.   You feel the baby is not moving as it usually does.  Date: ____________ Movements: ____________ Start time: ____________ Finish time: ____________  Date: ____________ Movements: ____________ Start time: ____________ Finish time: ____________ Date: ____________ Movements: ____________ Start time: ____________ Finish time: ____________ Date: ____________ Movements: ____________ Start time: ____________ Finish time: ____________ Date: ____________ Movements: ____________ Start time: ____________ Finish time:  ____________ Date: ____________ Movements: ____________ Start time: ____________ Finish time: ____________ Date: ____________ Movements: ____________ Start time: ____________ Finish time: ____________ Date: ____________ Movements: ____________ Start time: ____________ Finish time: ____________  Date: ____________ Movements: ____________ Start time: ____________ Finish time: ____________ Date: ____________ Movements: ____________ Start time: ____________ Finish time: ____________ Date: ____________ Movements: ____________ Start time: ____________ Finish time: ____________ Date: ____________ Movements: ____________ Start time: ____________ Finish time: ____________ Date: ____________ Movements: ____________ Start time: ____________ Finish time: ____________ Date: ____________ Movements: ____________ Start time: ____________ Finish time: ____________ Date: ____________ Movements: ____________ Start time: ____________ Finish time: ____________  Date: ____________ Movements: ____________ Start time: ____________ Finish time: ____________ Date: ____________ Movements: ____________ Start time: ____________ Finish time: ____________ Date: ____________ Movements: ____________ Start time: ____________ Finish time: ____________ Date: ____________ Movements: ____________ Start time: ____________ Finish time: ____________ Date: ____________ Movements: ____________ Start time: ____________ Finish time: ____________ Date: ____________ Movements: ____________ Start time: ____________ Finish time: ____________ Date: ____________ Movements: ____________ Start time: ____________ Finish time: ____________  Date: ____________ Movements: ____________ Start time: ____________ Finish time: ____________ Date: ____________ Movements: ____________ Start time: ____________ Finish time: ____________ Date: ____________ Movements: ____________ Start time: ____________ Finish time: ____________ Date: ____________ Movements:  ____________ Start time: ____________ Finish time: ____________ Date: ____________ Movements: ____________ Start time: ____________ Finish time: ____________ Date: ____________ Movements: ____________ Start time: ____________ Finish time: ____________ Date: ____________ Movements: ____________ Start time: ____________ Finish time: ____________  Date: ____________ Movements: ____________ Start time: ____________ Finish time: ____________ Date: ____________ Movements: ____________ Start time: ____________ Finish time: ____________ Date: ____________ Movements: ____________ Start time:   ____________ Finish time: ____________ Date: ____________ Movements: ____________ Start time: ____________ Finish time: ____________ Date: ____________ Movements: ____________ Start time: ____________ Finish time: ____________ Date: ____________ Movements: ____________ Start time: ____________ Finish time: ____________ Date: ____________ Movements: ____________ Start time: ____________ Finish time: ____________  Date: ____________ Movements: ____________ Start time: ____________ Finish time: ____________ Date: ____________ Movements: ____________ Start time: ____________ Finish time: ____________ Date: ____________ Movements: ____________ Start time: ____________ Finish time: ____________ Date: ____________ Movements: ____________ Start time: ____________ Finish time: ____________ Date: ____________ Movements: ____________ Start time: ____________ Finish time: ____________ Date: ____________ Movements: ____________ Start time: ____________ Finish time: ____________ Date: ____________ Movements: ____________ Start time: ____________ Finish time: ____________  Date: ____________ Movements: ____________ Start time: ____________ Finish time: ____________ Date: ____________ Movements: ____________ Start time: ____________ Finish time: ____________ Date: ____________ Movements: ____________ Start time: ____________ Finish  time: ____________ Date: ____________ Movements: ____________ Start time: ____________ Finish time: ____________ Date: ____________ Movements: ____________ Start time: ____________ Finish time: ____________ Date: ____________ Movements: ____________ Start time: ____________ Finish time: ____________ Date: ____________ Movements: ____________ Start time: ____________ Finish time: ____________  Date: ____________ Movements: ____________ Start time: ____________ Finish time: ____________ Date: ____________ Movements: ____________ Start time: ____________ Finish time: ____________ Date: ____________ Movements: ____________ Start time: ____________ Finish time: ____________ Date: ____________ Movements: ____________ Start time: ____________ Finish time: ____________ Date: ____________ Movements: ____________ Start time: ____________ Finish time: ____________ Date: ____________ Movements: ____________ Start time: ____________ Finish time: ____________ Document Released: 12/11/2006 Document Revised: 10/31/2011 Document Reviewed: 06/13/2009 [employer] Patient Information 2012 [employer] 

## 2012-08-07 NOTE — Progress Notes (Signed)
Pt without c/o FKC reviewed RT 2 weeks

## 2012-08-12 ENCOUNTER — Telehealth: Payer: Self-pay | Admitting: Obstetrics and Gynecology

## 2012-08-12 NOTE — Telephone Encounter (Signed)
TC from pt. States having lower back and abd pain that comes and goes, ,4 x/hr. Relieved with Tylenol. States had pink spotting with wiping x 1 today. Had IC 08/11/12. +FM No vaginal  Leakage. No dysuria. Per VL pt to observe and call with any further bleeding, increased pain or any concerns. Pt verbalizes comprehension.

## 2012-08-13 ENCOUNTER — Inpatient Hospital Stay (HOSPITAL_COMMUNITY)
Admission: AD | Admit: 2012-08-13 | Discharge: 2012-08-13 | DRG: 781 | Disposition: A | Discharge status: Home / Self Care | Payer: Medicaid Other | Source: Ambulatory Visit | Attending: Obstetrics and Gynecology | Admitting: Obstetrics and Gynecology

## 2012-08-13 ENCOUNTER — Encounter (HOSPITAL_COMMUNITY): Payer: Self-pay | Admitting: *Deleted

## 2012-08-13 ENCOUNTER — Inpatient Hospital Stay (HOSPITAL_COMMUNITY): Payer: Medicaid Other

## 2012-08-13 DIAGNOSIS — N1330 Unspecified hydronephrosis: Secondary | ICD-10-CM | POA: Diagnosis present

## 2012-08-13 DIAGNOSIS — D649 Anemia, unspecified: Secondary | ICD-10-CM | POA: Diagnosis present

## 2012-08-13 DIAGNOSIS — O99019 Anemia complicating pregnancy, unspecified trimester: Secondary | ICD-10-CM | POA: Diagnosis present

## 2012-08-13 DIAGNOSIS — R109 Unspecified abdominal pain: Secondary | ICD-10-CM | POA: Diagnosis present

## 2012-08-13 DIAGNOSIS — Z87442 Personal history of urinary calculi: Secondary | ICD-10-CM

## 2012-08-13 DIAGNOSIS — R319 Hematuria, unspecified: Secondary | ICD-10-CM

## 2012-08-13 DIAGNOSIS — D72829 Elevated white blood cell count, unspecified: Secondary | ICD-10-CM | POA: Diagnosis present

## 2012-08-13 DIAGNOSIS — O26839 Pregnancy related renal disease, unspecified trimester: Principal | ICD-10-CM | POA: Diagnosis present

## 2012-08-13 LAB — COMPREHENSIVE METABOLIC PANEL
BUN: 10 mg/dL (ref 6–23)
CO2: 23 mEq/L (ref 19–32)
Chloride: 100 mEq/L (ref 96–112)
Creatinine, Ser: 0.73 mg/dL (ref 0.50–1.10)
GFR calc Af Amer: >90 mL/min (ref >90)
GFR calc non Af Amer: >90 mL/min (ref >90)
Total Bilirubin: 0.2 mg/dL — ABNORMAL LOW (ref 0.3–1.2)

## 2012-08-13 LAB — URINALYSIS, ROUTINE W REFLEX MICROSCOPIC
Ketones, ur: NEGATIVE mg/dL
Leukocytes, UA: NEGATIVE
Nitrite: NEGATIVE
Specific Gravity, Urine: 1.025 (ref 1.005–1.030)
pH: 6 (ref 5.0–8.0)

## 2012-08-13 LAB — CBC
Platelets: 254 10*3/uL (ref 150–400)
RBC: 3.77 MIL/uL — ABNORMAL LOW (ref 3.87–5.11)
WBC: 11.7 10*3/uL — ABNORMAL HIGH (ref 4.0–10.5)

## 2012-08-13 LAB — CBC WITH DIFFERENTIAL/PLATELET
Basophils Relative: 0 % (ref 0–1)
Hemoglobin: 10.7 g/dL — ABNORMAL LOW (ref 12.0–15.0)
Lymphocytes Relative: 22 % (ref 12–46)
Lymphs Abs: 2.6 10*3/uL (ref 0.7–4.0)
Monocytes Relative: 8 % (ref 3–12)
Neutro Abs: 7.9 10*3/uL — ABNORMAL HIGH (ref 1.7–7.7)
Neutrophils Relative %: 68 % (ref 43–77)
RBC: 3.77 MIL/uL — ABNORMAL LOW (ref 3.87–5.11)
WBC: 11.6 10*3/uL — ABNORMAL HIGH (ref 4.0–10.5)

## 2012-08-13 LAB — URINE MICROSCOPIC-ADD ON

## 2012-08-13 LAB — WET PREP, GENITAL: Yeast Wet Prep HPF POC: NONE SEEN

## 2012-08-13 MED ORDER — ACETAMINOPHEN 500 MG PO TABS
1000.0000 mg | ORAL_TABLET | ORAL | Status: DC | PRN
  Administered 2012-08-13: 1000 mg via ORAL
  Filled 2012-08-13: qty 2

## 2012-08-13 MED ORDER — HYDROMORPHONE HCL PF 1 MG/ML IJ SOLN
1.0000 mg | INTRAMUSCULAR | Status: DC | PRN
  Administered 2012-08-13: 2 mg via INTRAVENOUS
  Filled 2012-08-13: qty 2

## 2012-08-13 MED ORDER — NITROFURANTOIN MONOHYD MACRO 100 MG PO CAPS: 100.0000 mg | ORAL_CAPSULE | Freq: Two times a day (BID) | ORAL | Status: DC

## 2012-08-13 MED ORDER — DOCUSATE SODIUM 100 MG PO CAPS
100.0000 mg | ORAL_CAPSULE | Freq: Every day | ORAL | Status: DC
  Administered 2012-08-13: 100 mg via ORAL
  Filled 2012-08-13: qty 1

## 2012-08-13 MED ORDER — LACTATED RINGERS IV SOLN
INTRAVENOUS | Status: DC
  Administered 2012-08-13: 15:00:00 via INTRAVENOUS

## 2012-08-13 MED ORDER — HYDROMORPHONE HCL PF 1 MG/ML IJ SOLN
1.0000 mg | Freq: Once | INTRAMUSCULAR | Status: AC
  Administered 2012-08-13: 1 mg via INTRAVENOUS

## 2012-08-13 MED ORDER — ZOLPIDEM TARTRATE 5 MG PO TABS: 5.0000 mg | ORAL_TABLET | Freq: Every evening | ORAL | Status: DC | PRN

## 2012-08-13 MED ORDER — PROMETHAZINE HCL 25 MG/ML IJ SOLN
12.5000 mg | INTRAMUSCULAR | Status: DC | PRN
  Administered 2012-08-13: 12.5 mg via INTRAVENOUS
  Filled 2012-08-13: qty 1

## 2012-08-13 MED ORDER — CALCIUM CARBONATE ANTACID 500 MG PO CHEW: 2.0000 | CHEWABLE_TABLET | ORAL | Status: DC | PRN

## 2012-08-13 MED ORDER — LACTATED RINGERS IV BOLUS (SEPSIS)
500.0000 mL | Freq: Once | INTRAVENOUS | Status: AC
  Administered 2012-08-13: 1000 mL via INTRAVENOUS

## 2012-08-13 MED ORDER — CALCIUM CARBONATE ANTACID 500 MG PO CHEW: 2.0000 | CHEWABLE_TABLET | Freq: Every day | ORAL | Status: DC

## 2012-08-13 MED ORDER — PRENATAL MULTIVITAMIN CH
1.0000 | ORAL_TABLET | Freq: Every day | ORAL | Status: DC
  Administered 2012-08-13: 1 via ORAL
  Filled 2012-08-13: qty 1

## 2012-08-13 MED ORDER — HYDROMORPHONE HCL PF 1 MG/ML IJ SOLN
INTRAMUSCULAR | Status: AC
  Administered 2012-08-13: 1 mg via INTRAVENOUS
  Filled 2012-08-13: qty 1

## 2012-08-13 MED ORDER — INFLUENZA VIRUS VACC SPLIT PF IM SUSP
0.5000 mL | Freq: Once | INTRAMUSCULAR | Status: AC
  Administered 2012-08-13: 0.5 mL via INTRAMUSCULAR
  Filled 2012-08-13: qty 0.5

## 2012-08-13 MED ORDER — HYDROMORPHONE HCL PF 1 MG/ML IJ SOLN
1.0000 mg | Freq: Once | INTRAMUSCULAR | Status: AC
  Administered 2012-08-13: 1 mg via INTRAVENOUS
  Filled 2012-08-13: qty 1

## 2012-08-13 MED ORDER — HYDROCODONE-ACETAMINOPHEN 5-500 MG PO TABS: 1.0000 | ORAL_TABLET | ORAL | Status: DC | PRN

## 2012-08-13 NOTE — Progress Notes (Signed)
Subjective: Patient reports no nausea, vomiting, tolerating PO and no problems voiding. She has required no additional pain medication since arrival from MAU at 740 am except tylenol for a headache.  She has tolerated a regular diet and has had lots of fluids to drink.     Objective: I have reviewed patient's vital signs, intake and output, medications, labs and FHR tracing.  General: alert, cooperative, appears stated age and no distress Mild CVAT. nst AT 11AM reassuring for 33 weeks, CAT I tracing   Assessment: Probable recurrent kidney stone Improved pain R/O UTI  Plan 1) D/C home  2) Increased po fluids 3) Macrobid until urine C&S is complete 4) Keep appt on 08/20/12 5) po pain meds as needed  LOS: 0 days    [name] [name] P 08/13/2012, 6:13 PM

## 2012-08-13 NOTE — Progress Notes (Signed)
Pt discharged to home with husband.  Condition stable.  Pt ambulated to car with D. Sandy Salaam, [place].  Pt home with urinary hat, graduate cylinder, and urine strainer.  Pt explained back how to strain her urine at home.  No other equipment ordered for home at discharge.

## 2012-08-13 NOTE — Progress Notes (Signed)
UR Chart review completed.  

## 2012-08-13 NOTE — Progress Notes (Addendum)
S: Patient had Low AFI = 8 0 : Amni- sure Testing this morning  - negative A: Hydro - nephrosis , moderate. P: Analgesia as needed     Observation of condition and possible Renal Consult and further USS for AFI surveillance.  Earl Gala, [name].

## 2012-08-13 NOTE — Discharge Summary (Signed)
DISCHARGE SUMMARY Patient reports no nausea, vomiting, tolerating PO and no problems voiding. She has required no additional pain medication since arrival from MAU at 740 am except tylenol for a headache.  She has tolerated a regular diet and has had lots of fluids to drink.     Objective: I have reviewed patient's vital signs, intake and output, medications, labs and FHR tracing.  General: alert, cooperative, appears stated age and no distress Mild CVAT. nst AT 11AM reassuring for 33 weeks, CAT I tracing   Assessment: Probable recurrent kidney stone Improved pain R/O UTI  Plan 1) D/C home  2) Increased po fluids 3) Macrobid until urine C&S is complete 4) Keep appt on 08/20/12 5) po pain meds as needed  LOS: 0 days    [name] [name] P 08/13/2012, 6:13 PM

## 2012-08-13 NOTE — Progress Notes (Signed)
Instructions provided to patient at bedside.  Follow up appointments, medications and educations provided.  No questions at this time.  Patient left unit with prescriptions in stable condition.  Osvaldo Angst, RN---------

## 2012-08-13 NOTE — Progress Notes (Signed)
S: Patient remains in MAU with c/o Rt Sided Flank Pain. O: Patient comfortable with Dilaudid 2 mgs IV and Phenergan 25 mgs IV      Explained condition to patient and states that she has an understanding of her condition.     AFI = 8 on IB USS 08/13/12    Amni-sure - test performed - to lab. A: Rt Moderate Hydro-nephrosis P: Awaiting Amni- sure result from lab.     Transfer patient to 3rd floor Rm 319.     Possible renal consult later today.    Dr. Pennie Rushing has collaborated with patient management plan.  Earl Gala, [name].

## 2012-08-13 NOTE — MAU Provider Note (Signed)
History     CSN: [id number]  Arrival date and time: 08/13/12 0234   None     Chief Complaint  Patient presents with  . Abdominal Pain   HPI Comments: Pt is a 31yo G4P2 at [redacted]w[redacted]d that arrived unannounced w c/o R sided flank pain, started this afternoon, and has gotten worse. Pt denies ctx, had spotting this afternoon but none since, denies any LOF, GFM. Pt last had IC yesterday.   Abdominal Pain Pertinent negatives include no constipation, dysuria, fever, nausea or vomiting.      Past Medical History  Diagnosis Date  . HPV (human papilloma virus) infection   . Abnormal Pap smear 2000    COLPO X2; LAST PAP 2012  . Infection 2009    UTI   . Kidney stones 2009  . Headache     FREQUENT  . Fracture of right hand 2012    Past Surgical History  Procedure Date  . Colposcopy   . Appendectomy 2001    DURING PREGNANCY    Family History  Problem Relation Age of Onset  . Anesthesia problems Neg Hx   . Depression Son 9    AFTER DEATH OF FATHER  . COPD Paternal Grandfather     History  Substance Use Topics  . Smoking status: Never Smoker   . Smokeless tobacco: Never Used  . Alcohol Use: No     OCCASIONAL MIXED DRINK    Allergies: No Known Allergies  Prescriptions prior to admission  Medication Sig Dispense Refill  . calcium carbonate (TUMS - DOSED IN MG ELEMENTAL CALCIUM) 500 MG chewable tablet Chew 1 tablet by mouth once.      . Prenatal Multivit-Min-Fe-FA (PRENATAL VITAMINS) 0.8 MG tablet Take 1 tablet by mouth daily.  30 tablet  12  . promethazine (PHENERGAN) 12.5 MG tablet Take 1 tablet (12.5 mg total) by mouth every 6 (six) hours as needed for nausea.  30 tablet  2    Review of Systems  Constitutional: Negative for fever.  Gastrointestinal: Negative for nausea, vomiting, abdominal pain and constipation.  Genitourinary: Negative for dysuria.  Musculoskeletal: Positive for back pain.       R flank pain  All other systems reviewed and are  negative.   Physical Exam   Blood pressure 112/75, pulse 108, temperature 98.5 F (36.9 C), temperature source Oral, resp. rate 20, height 5\' 4"  (1.626 m), weight 144 lb 4 oz (65.431 kg), last menstrual period 12/11/2011.  Physical Exam  Nursing note and vitals reviewed. Constitutional: She is oriented to person, place, and time. She appears well-developed and well-nourished. She appears distressed.       Pt wincing and moaning, holding R side, difficulty answering questions.   HENT:  Head: Normocephalic.  Eyes: Pupils are equal, round, and reactive to light.  Neck: Normal range of motion.  Cardiovascular: Normal rate and normal heart sounds.   Respiratory: Effort normal and breath sounds normal.  GI: Soft. Bowel sounds are normal. She exhibits no distension. There is no tenderness. There is no rebound.       CVAT neg   Genitourinary: Vagina normal.       Cervix friable, no blood in vault  Cl/th/-1, anterior, vertex low in pelvix but ballots   Musculoskeletal: Normal range of motion. She exhibits no edema.  Neurological: She is alert and oriented to person, place, and time.  Skin: Skin is warm and dry.  Psychiatric: She has a normal mood and affect. Her behavior is normal.  MAU Course  Procedures    Assessment and Plan  IUP at [redacted]w[redacted]d R flank pain ?kidney stone FHR reassuring No signs PTL   UA sent Wet prep sent GC/CT sent IVF's LR Dilaudid IV for pain CBC, CMET  Henderson Frampton M 08/13/2012, 3:01 AM

## 2012-08-13 NOTE — MAU Note (Signed)
Pt has pain on the rt lower side/flank area that is constant and extends down into her groin area

## 2012-08-13 NOTE — H&P (Signed)
[name] [name] is a 31 y.o. female presenting for R flank pain, denies any ctx, no LOF, no VB, GFM.   HPI: pt began [facility] at 17wks. She had normal anatomy scan. She was admitted overnight for observation at 29wks for abdominal pain, all results WNL. Otherwise normal prenatal course.  Maternal Medical History:  Reason for admission: Reason for Admission:   nauseaR flank pain  Contractions: denies  Fetal activity: Perceived fetal activity is normal.   Last perceived fetal movement was within the past hour.    Prenatal complications: no prenatal complications   OB History    Grav Para Term Preterm Abortions TAB SAB Ect Mult Living   4 2 2  1  1   2      Past Medical History  Diagnosis Date  . HPV (human papilloma virus) infection   . Abnormal Pap smear 2000    COLPO X2; LAST PAP 2012  . Infection 2009    UTI   . Kidney stones 2009  . Headache     FREQUENT  . Fracture of right hand 2012   Past Surgical History  Procedure Date  . Colposcopy   . Appendectomy 2001    DURING PREGNANCY   Family History: family history includes COPD in her paternal grandfather and Depression (age of onset:9) in her son.  There is no history of Anesthesia problems. Social History:  reports that she has never smoked. She has never used smokeless tobacco. She reports that she does not drink alcohol or use illicit drugs.   Prenatal Transfer Tool  Maternal Diabetes: No Genetic Screening: Declined Maternal Ultrasounds/Referrals: Normal Fetal Ultrasounds or other Referrals:  None Maternal Substance Abuse:  No Significant Maternal Medications:  None Significant Maternal Lab Results:  None Other Comments:  None  Review of Systems  Constitutional: Negative for fever.  Gastrointestinal: Negative for nausea and vomiting.  Musculoskeletal: Positive for back pain.       R flank pain  All other systems reviewed and are negative.    Dilation: Closed Effacement (%): Thick Exam by::  S.Loredana Medellin,[name] Blood pressure 108/73, pulse 108, temperature 98.5 F (36.9 C), temperature source Oral, resp. rate 20, height 5\' 4"  (1.626 m), weight 144 lb 4 oz (65.431 kg), last menstrual period 12/11/2011, SpO2 100.00%. Maternal Exam:  Abdomen: Fundal height is aga.   Fetal presentation: vertex  Introitus: Normal vulva. Normal vagina.  Pelvis: adequate for delivery.   Cervix: Cervix evaluated by sterile speculum exam and digital exam.     Fetal Exam Fetal Monitor Review: Mode: ultrasound.   Baseline rate: 140.  Variability: moderate (6-25 bpm).   Pattern: accelerations present and no decelerations.    Fetal State Assessment: Category I - tracings are normal.     Physical Exam  Vitals reviewed. Constitutional: She is oriented to person, place, and time. She appears well-developed and well-nourished. She appears distressed.  HENT:  Head: Normocephalic.  Eyes: Pupils are equal, round, and reactive to light.  Neck: Normal range of motion.  Cardiovascular: Regular rhythm and normal heart sounds.        Slightly tachycardic   Respiratory: Effort normal and breath sounds normal.  GI: Soft. Bowel sounds are normal. She exhibits no distension. There is no tenderness. There is no rebound.  Genitourinary: Vagina normal.  Musculoskeletal: Normal range of motion. She exhibits no edema.  Neurological: She is alert and oriented to person, place, and time. She has normal reflexes.  Skin: Skin is warm and dry.  Psychiatric: She has a normal mood and affect. Her behavior is normal.    Results for orders placed during the hospital encounter of 08/13/12 (from the past 24 hour(s))  WET PREP, GENITAL     Status: Abnormal   Collection Time   08/13/12  2:40 AM      Component Value Range   Yeast Wet Prep HPF POC NONE SEEN  NONE SEEN   Trich, Wet Prep NONE SEEN  NONE SEEN   Clue Cells Wet Prep HPF POC NONE SEEN  NONE SEEN   WBC, Wet Prep HPF POC FEW (*) NONE SEEN  URINALYSIS, ROUTINE W REFLEX  MICROSCOPIC     Status: Abnormal   Collection Time   08/13/12  2:50 AM      Component Value Range   Color, Urine YELLOW  YELLOW   APPearance CLEAR  CLEAR   Specific Gravity, Urine 1.025  1.005 - 1.030   pH 6.0  5.0 - 8.0   Glucose, UA NEGATIVE  NEGATIVE mg/dL   Hgb urine dipstick LARGE (*) NEGATIVE   Bilirubin Urine NEGATIVE  NEGATIVE   Ketones, ur NEGATIVE  NEGATIVE mg/dL   Protein, ur NEGATIVE  NEGATIVE mg/dL   Urobilinogen, UA 0.2  0.0 - 1.0 mg/dL   Nitrite NEGATIVE  NEGATIVE   Leukocytes, UA NEGATIVE  NEGATIVE  URINE MICROSCOPIC-ADD ON     Status: Normal   Collection Time   08/13/12  2:50 AM      Component Value Range   Squamous Epithelial / LPF RARE  RARE   WBC, UA 0-2  <3 WBC/hpf   RBC / HPF 21-50  <3 RBC/hpf  COMPREHENSIVE METABOLIC PANEL     Status: Abnormal   Collection Time   08/13/12  3:05 AM      Component Value Range   Sodium 135  135 - 145 mEq/L   Potassium 3.9  3.5 - 5.1 mEq/L   Chloride 100  96 - 112 mEq/L   CO2 23  19 - 32 mEq/L   Glucose, Bld 112 (*) 70 - 99 mg/dL   BUN 10  6 - 23 mg/dL   Creatinine, Ser 1.61  0.50 - 1.10 mg/dL   Calcium 8.9  8.4 - 09.6 mg/dL   Total Protein 5.8 (*) 6.0 - 8.3 g/dL   Albumin 2.5 (*) 3.5 - 5.2 g/dL   AST 12  0 - 37 U/L   ALT 10  0 - 35 U/L   Alkaline Phosphatase 146 (*) 39 - 117 U/L   Total Bilirubin 0.2 (*) 0.3 - 1.2 mg/dL   GFR calc non Af Amer >90  >90 mL/min   GFR calc Af Amer >90  >90 mL/min  CBC     Status: Abnormal   Collection Time   08/13/12  3:05 AM      Component Value Range   WBC 11.7 (*) 4.0 - 10.5 K/uL   RBC 3.77 (*) 3.87 - 5.11 MIL/uL   Hemoglobin 10.8 (*) 12.0 - 15.0 g/dL   HCT 04.5 (*) 40.9 - 81.1 %   MCV 86.7  78.0 - 100.0 fL   MCH 28.6  26.0 - 34.0 pg   MCHC 33.0  30.0 - 36.0 g/dL   RDW 91.4  78.2 - 95.6 %   Platelets 254  150 - 400 K/uL  CBC WITH DIFFERENTIAL     Status: Abnormal   Collection Time   08/13/12  6:36 AM      Component Value Range   WBC  11.6 (*) 4.0 - 10.5 K/uL   RBC 3.77 (*)  3.87 - 5.11 MIL/uL   Hemoglobin 10.7 (*) 12.0 - 15.0 g/dL   HCT 14.7 (*) 82.9 - 56.2 %   MCV 87.5  78.0 - 100.0 fL   MCH 28.4  26.0 - 34.0 pg   MCHC 32.4  30.0 - 36.0 g/dL   RDW 13.0  86.5 - 78.4 %   Platelets 254  150 - 400 K/uL   Neutrophils Relative 68  43 - 77 %   Neutro Abs 7.9 (*) 1.7 - 7.7 K/uL   Lymphocytes Relative 22  12 - 46 %   Lymphs Abs 2.6  0.7 - 4.0 K/uL   Monocytes Relative 8  3 - 12 %   Monocytes Absolute 1.0  0.1 - 1.0 K/uL   Eosinophils Relative 1  0 - 5 %   Eosinophils Absolute 0.1  0.0 - 0.7 K/uL   Basophils Relative 0  0 - 1 %   Basophils Absolute 0.0  0.0 - 0.1 K/uL      Prenatal labs: ABO, Rh: O/POS/-- (05/09 1438) Antibody: NEG (05/09 1438) Rubella: 16.3 (05/09 1438) RPR: NON REAC (08/08 1236)  HBsAg: NEGATIVE (05/09 1438)  HIV: NON REACTIVE (05/09 1438)  GBS:   neg from July    Assessment/Plan: IUP at [redacted]w[redacted]d Mild leukocytosis Mild anemia UA +blood, sent for cx Renal US - R hydronephrosis, other wise normal, no stone identified FHR reassuring No signs of PTL   Admit to 3rd floor for pain mgmnt Routine orders NST q shift Strain urine Dilaudid 1-2mg  q3hr PRN nubain 12.5mg  IV q4hr prn Will check amnisure D/W Dr Evlyn Kanner M 08/13/2012, 7:15 AM

## 2012-08-14 LAB — GC/CHLAMYDIA PROBE AMP, GENITAL
Chlamydia, DNA Probe: NEGATIVE
GC Probe Amp, Genital: NEGATIVE

## 2012-08-14 LAB — URINE CULTURE: Colony Count: 4000

## 2012-08-16 ENCOUNTER — Inpatient Hospital Stay (HOSPITAL_COMMUNITY)
Admission: AD | Admit: 2012-08-16 | Discharge: 2012-08-18 | DRG: 781 | Disposition: A | Discharge status: Home / Self Care | Payer: Medicaid Other | Source: Ambulatory Visit | Attending: Obstetrics and Gynecology | Admitting: Obstetrics and Gynecology

## 2012-08-16 ENCOUNTER — Inpatient Hospital Stay (HOSPITAL_COMMUNITY): Payer: Medicaid Other

## 2012-08-16 ENCOUNTER — Encounter (HOSPITAL_COMMUNITY): Payer: Self-pay | Admitting: *Deleted

## 2012-08-16 DIAGNOSIS — K5900 Constipation, unspecified: Secondary | ICD-10-CM | POA: Diagnosis present

## 2012-08-16 DIAGNOSIS — R109 Unspecified abdominal pain: Secondary | ICD-10-CM | POA: Diagnosis present

## 2012-08-16 DIAGNOSIS — O0930 Supervision of pregnancy with insufficient antenatal care, unspecified trimester: Secondary | ICD-10-CM

## 2012-08-16 DIAGNOSIS — N200 Calculus of kidney: Secondary | ICD-10-CM

## 2012-08-16 DIAGNOSIS — R51 Headache: Secondary | ICD-10-CM

## 2012-08-16 DIAGNOSIS — N760 Acute vaginitis: Secondary | ICD-10-CM

## 2012-08-16 DIAGNOSIS — N926 Irregular menstruation, unspecified: Secondary | ICD-10-CM

## 2012-08-16 DIAGNOSIS — O99891 Other specified diseases and conditions complicating pregnancy: Secondary | ICD-10-CM | POA: Diagnosis present

## 2012-08-16 DIAGNOSIS — B9689 Other specified bacterial agents as the cause of diseases classified elsewhere: Secondary | ICD-10-CM

## 2012-08-16 DIAGNOSIS — B977 Papillomavirus as the cause of diseases classified elsewhere: Secondary | ICD-10-CM

## 2012-08-16 DIAGNOSIS — O26839 Pregnancy related renal disease, unspecified trimester: Principal | ICD-10-CM | POA: Diagnosis present

## 2012-08-16 DIAGNOSIS — Z331 Pregnant state, incidental: Secondary | ICD-10-CM

## 2012-08-16 DIAGNOSIS — O98519 Other viral diseases complicating pregnancy, unspecified trimester: Secondary | ICD-10-CM

## 2012-08-16 LAB — COMPREHENSIVE METABOLIC PANEL
ALT: 10 U/L (ref 0–35)
AST: 13 U/L (ref 0–37)
Alkaline Phosphatase: 150 U/L — ABNORMAL HIGH (ref 39–117)
CO2: 22 mEq/L (ref 19–32)
Chloride: 100 mEq/L (ref 96–112)
GFR calc non Af Amer: >90 mL/min (ref >90)
Sodium: 134 mEq/L — ABNORMAL LOW (ref 135–145)
Total Bilirubin: 0.1 mg/dL — ABNORMAL LOW (ref 0.3–1.2)

## 2012-08-16 LAB — CBC WITH DIFFERENTIAL/PLATELET
Eosinophils Relative: 1 % (ref 0–5)
HCT: 32.3 % — ABNORMAL LOW (ref 36.0–46.0)
Lymphocytes Relative: 15 % (ref 12–46)
Lymphs Abs: 1.6 10*3/uL (ref 0.7–4.0)
MCV: 87.1 fL (ref 78.0–100.0)
Platelets: 238 10*3/uL (ref 150–400)
RBC: 3.71 MIL/uL — ABNORMAL LOW (ref 3.87–5.11)
WBC: 10.7 10*3/uL — ABNORMAL HIGH (ref 4.0–10.5)

## 2012-08-16 LAB — TYPE AND SCREEN

## 2012-08-16 LAB — URINALYSIS, ROUTINE W REFLEX MICROSCOPIC
Glucose, UA: NEGATIVE mg/dL
Ketones, ur: NEGATIVE mg/dL
Leukocytes, UA: NEGATIVE
pH: 6.5 (ref 5.0–8.0)

## 2012-08-16 MED ORDER — DOCUSATE SODIUM 100 MG PO CAPS
100.0000 mg | ORAL_CAPSULE | Freq: Every day | ORAL | Status: DC
  Administered 2012-08-17 – 2012-08-18 (×2): 100 mg via ORAL
  Filled 2012-08-16 (×3): qty 1

## 2012-08-16 MED ORDER — LACTATED RINGERS IV SOLN
INTRAVENOUS | Status: DC
  Administered 2012-08-16: 19:00:00 via INTRAVENOUS
  Administered 2012-08-16: 150 mL/h via INTRAVENOUS
  Administered 2012-08-17 – 2012-08-18 (×5): via INTRAVENOUS

## 2012-08-16 MED ORDER — PRENATAL MULTIVITAMIN CH
1.0000 | ORAL_TABLET | Freq: Every day | ORAL | Status: DC
  Administered 2012-08-16 – 2012-08-18 (×3): 1 via ORAL
  Filled 2012-08-16 (×3): qty 1

## 2012-08-16 MED ORDER — LACTATED RINGERS IV BOLUS (SEPSIS)
300.0000 mL | Freq: Once | INTRAVENOUS | Status: AC
  Administered 2012-08-16: 1000 mL via INTRAVENOUS

## 2012-08-16 MED ORDER — ACETAMINOPHEN 325 MG PO TABS: 650.0000 mg | ORAL_TABLET | ORAL | Status: DC | PRN

## 2012-08-16 MED ORDER — PROMETHAZINE HCL 25 MG/ML IJ SOLN
12.5000 mg | Freq: Four times a day (QID) | INTRAMUSCULAR | Status: DC | PRN
  Administered 2012-08-16 (×2): 12.5 mg via INTRAVENOUS
  Filled 2012-08-16 (×2): qty 1

## 2012-08-16 MED ORDER — HYDROMORPHONE HCL PF 1 MG/ML IJ SOLN
2.0000 mg | INTRAMUSCULAR | Status: DC | PRN
  Administered 2012-08-16 – 2012-08-17 (×5): 2 mg via INTRAVENOUS
  Filled 2012-08-16 (×6): qty 2

## 2012-08-16 MED ORDER — CALCIUM CARBONATE ANTACID 500 MG PO CHEW: 2.0000 | CHEWABLE_TABLET | ORAL | Status: DC | PRN

## 2012-08-16 MED ORDER — HYDROMORPHONE HCL PF 1 MG/ML IJ SOLN
2.0000 mg | Freq: Once | INTRAMUSCULAR | Status: AC
  Administered 2012-08-16: 2 mg via INTRAVENOUS
  Filled 2012-08-16 (×2): qty 1

## 2012-08-16 MED ORDER — ZOLPIDEM TARTRATE 5 MG PO TABS: 5.0000 mg | ORAL_TABLET | Freq: Every evening | ORAL | Status: DC | PRN

## 2012-08-16 NOTE — MAU Note (Signed)
Patient is in with c/o  Right flank pain and lower back pain. She states that she was recently seen for the same pain and was told that it was possibly her kidneys.

## 2012-08-16 NOTE — H&P (Signed)
31 yo G4P2012 at 34 weeks presented unannounced c/o recurrence of right flank pain--she was admitted on 08/13/12 with same symptoms, with right hydronephrosis noted on renal US.  Symptoms resolved that same day after IV hydration, and patient was d/c'd home with Vicodin and Macrobid.  She reports resumption of sx earlier in the evening, and no benefit from Vicodin.  Denies leaking, bleeding, or definite contractions--does feel some tightening sensations.  No N/V, dysuria, or frequency.  AFI was 8.1 on 9/19, but this was calculated via EDC of 09/12/12 (LMP EDC, but unsure LMP).  Correct EDC is 09/27/12 by 7 3/7 week [place] = 34 weeks.  Had negative urine culture 9/19, GC/chlamydia, and amnisure on 9/19.    History of present pregnancy: Patient entered care at 37 2/7 weeks.  EDC of 09/27/13 was established by [place] at 7 3/7 weeks.  Anatomy scan was done at 19 4/7 weeks, with normal findings.  She was admitted for observation on 9/19 for presumptive kidney stone, but was able to be discharged later that day.  Was sent home on Vicodin and Macrobid, but had resumption of symptoms last evening, refractory to pain medication.  While admitted that day, she had an AFI of 8.1 (5%ile), but this was calculated with EDC of 09/12/12, from an uncertain LMP.     Patient Active Problem List  Diagnosis  . HPV (human papilloma virus) infection  . Pregnant state, incidental  . Kidney stone - 2010  . Headache - frequent  . Bacterial vaginosis - 1st trimester  . Late prenatal care - 17wks  . Menses, irregular  . Kidney stone complicating pregnancy     Chief Complaint  Patient presents with  . Flank Pain    OB History    Grav Para Term Preterm Abortions TAB SAB Ect Mult Living   4 2 2  1  1   2       Past Medical History  Diagnosis Date  . HPV (human papilloma virus) infection   . Abnormal Pap smear 2000    COLPO X2; LAST PAP 2012  . Infection 2009    UTI   . Kidney stones 2009  . Headache     FREQUENT    . Fracture of right hand 2012    Past Surgical History  Procedure Date  . Colposcopy   . Appendectomy 2001    DURING PREGNANCY    Family History  Problem Relation Age of Onset  . Anesthesia problems Neg Hx   . Other Neg Hx   . Depression Son 9    AFTER DEATH OF FATHER  . COPD Paternal Grandfather     History  Substance Use Topics  . Smoking status: Never Smoker   . Smokeless tobacco: Never Used  . Alcohol Use: No     OCCASIONAL MIXED DRINK    Allergies: No Known Allergies  Prescriptions prior to admission  Medication Sig Dispense Refill  . HYDROcodone-acetaminophen (VICODIN) 5-500 MG per tablet Take 1 tablet by mouth every 4 (four) hours as needed for pain.  30 tablet  0  . nitrofurantoin, macrocrystal-monohydrate, (MACROBID) 100 MG capsule Take 1 capsule (100 mg total) by mouth 2 (two) times daily.  14 capsule  0  . calcium carbonate (TUMS - DOSED IN MG ELEMENTAL CALCIUM) 500 MG chewable tablet Chew 2 tablets (400 mg of elemental calcium total) by mouth daily.      . Prenatal Multivit-Min-Fe-FA (PRENATAL VITAMINS) 0.8 MG tablet Take 1 tablet by mouth daily.  30 tablet  12     Physical Exam   Temperature 98.2 F (36.8 C), temperature source Oral, resp. rate 24, last menstrual period 12/11/2011.  Chest clear Heart RRR without murmur Abd gravid, NT Pelvic--cervix ext os 1 cm, internal os closed, 50%, vtx, -2. Back--right CVAT Ext WNL  FHR reactive, no decels UCs none  Results for orders placed during the hospital encounter of 08/16/12 (from the past 24 hour(s))  URINALYSIS, ROUTINE W REFLEX MICROSCOPIC     Status: Abnormal   Collection Time   08/16/12  4:58 AM      Component Value Range   Color, Urine YELLOW  YELLOW   APPearance CLOUDY (*) CLEAR   Specific Gravity, Urine 1.025  1.005 - 1.030   pH 6.5  5.0 - 8.0   Glucose, UA NEGATIVE  NEGATIVE mg/dL   Hgb urine dipstick LARGE (*) NEGATIVE   Bilirubin Urine NEGATIVE  NEGATIVE   Ketones, ur NEGATIVE   NEGATIVE mg/dL   Protein, ur 30 (*) NEGATIVE mg/dL   Urobilinogen, UA 0.2  0.0 - 1.0 mg/dL   Nitrite NEGATIVE  NEGATIVE   Leukocytes, UA NEGATIVE  NEGATIVE  URINE MICROSCOPIC-ADD ON     Status: Abnormal   Collection Time   08/16/12  4:58 AM      Component Value Range   Squamous Epithelial / LPF MANY (*) RARE   WBC, UA 3-6  <3 WBC/hpf   RBC / HPF 21-50  <3 RBC/hpf   Bacteria, UA FEW (*) RARE  CBC WITH DIFFERENTIAL     Status: Abnormal   Collection Time   08/16/12  5:45 AM      Component Value Range   WBC 10.7 (*) 4.0 - 10.5 K/uL   RBC 3.71 (*) 3.87 - 5.11 MIL/uL   Hemoglobin 10.5 (*) 12.0 - 15.0 g/dL   HCT 16.1 (*) 09.6 - 04.5 %   MCV 87.1  78.0 - 100.0 fL   MCH 28.3  26.0 - 34.0 pg   MCHC 32.5  30.0 - 36.0 g/dL   RDW 40.9  81.1 - 91.4 %   Platelets 238  150 - 400 K/uL   Neutrophils Relative 75  43 - 77 %   Neutro Abs 8.1 (*) 1.7 - 7.7 K/uL   Lymphocytes Relative 15  12 - 46 %   Lymphs Abs 1.6  0.7 - 4.0 K/uL   Monocytes Relative 8  3 - 12 %   Monocytes Absolute 0.9  0.1 - 1.0 K/uL   Eosinophils Relative 1  0 - 5 %   Eosinophils Absolute 0.1  0.0 - 0.7 K/uL   Basophils Relative 0  0 - 1 %   Basophils Absolute 0.0  0.0 - 0.1 K/uL  COMPREHENSIVE METABOLIC PANEL     Status: Abnormal   Collection Time   08/16/12  5:45 AM      Component Value Range   Sodium 134 (*) 135 - 145 mEq/L   Potassium 3.6  3.5 - 5.1 mEq/L   Chloride 100  96 - 112 mEq/L   CO2 22  19 - 32 mEq/L   Glucose, Bld 108 (*) 70 - 99 mg/dL   BUN 8  6 - 23 mg/dL   Creatinine, Ser 7.82  0.50 - 1.10 mg/dL   Calcium 8.9  8.4 - 95.6 mg/dL   Total Protein 6.0  6.0 - 8.3 g/dL   Albumin 2.7 (*) 3.5 - 5.2 g/dL   AST 13  0 - 37 U/L  ALT 10  0 - 35 U/L   Alkaline Phosphatase 150 (*) 39 - 117 U/L   Total Bilirubin 0.1 (*) 0.3 - 1.2 mg/dL   GFR calc non Af Amer >90  >90 mL/min   GFR calc Af Amer >90  >90 mL/min     ED Course  IUP at 34 weeks Probable right kidney stone--recurrence of sx AFI of 8 08/13/12, but ?  Use of inaccurate EDC  Plan: IV hydration CBC, CMP, UA Pain medication--Dilaudid IV Anti-emetic prn Strain urine Will consult with Dr. Normand Sloop regarding f/u on ? Decreased AFI.  Per consult with Dr. Normand Sloop, plan admission for IV hydration, pain medication, repeat renal US, and OB US for growth, fluid, and BPP. Renal consult prn after renal US is reviewed.  Upon review of previous ER records from 2010, had bilateral kidney stones.  Was treated in the ER, and referred to [facility]--patient recalls "maybe seeing someone there...", but unsure.  Nigel Bridgeman [name], [place] 08/16/2012 5:57 AM

## 2012-08-16 NOTE — Progress Notes (Signed)
Dr. Normand Sloop at bedside and notified of pt status, FHR, UC pattern, urine strained and clear in color, and U/S results.  Dr. Normand Sloop explained to pt the POC, pt verbalized understandings.  Will continue to monitor.

## 2012-08-16 NOTE — MAU Note (Addendum)
Pt reports R sided back pain since 12:00 yesterday. Pt took pain medicineand the pain improved. Then the pain returned around 0000. Pt reports taking another dose of pain med @ 0300, with no relief of pain.

## 2012-08-16 NOTE — Progress Notes (Signed)
Patient ID: [name] [name], female   DOB: 17-Feb-1981, 31 y.o.   MRN: [id number] Pt with complaints of back pain.  No leakage of fluid or VB.  Good FM  BP 116/71  Pulse 97  Temp 98.7 F (37.1 C) (Oral)  Resp 18  Ht 5\' 4"  (1.626 m)  Wt 144 lb (65.318 kg)  BMI 24.72 kg/m2  LMP 12/11/2011  FHTS Baseline: 130 bpm, Variability: Good {> 6 bpm) and Accelerations: Reactive  Toco irregular, every 10-20 minutes  Pt in NAD CV RRR Lungs CTAB Back right CVA tenderness abd  Gravid soft and NT GU no vb EXt no calf tenderness Results for orders placed during the hospital encounter of 08/16/12 (from the past 72 hour(s))  URINALYSIS, ROUTINE W REFLEX MICROSCOPIC     Status: Abnormal   Collection Time   08/16/12  4:58 AM      Component Value Range Comment   Color, Urine YELLOW  YELLOW    APPearance CLOUDY (*) CLEAR    Specific Gravity, Urine 1.025  1.005 - 1.030    pH 6.5  5.0 - 8.0    Glucose, UA NEGATIVE  NEGATIVE mg/dL    Hgb urine dipstick LARGE (*) NEGATIVE    Bilirubin Urine NEGATIVE  NEGATIVE    Ketones, ur NEGATIVE  NEGATIVE mg/dL    Protein, ur 30 (*) NEGATIVE mg/dL    Urobilinogen, UA 0.2  0.0 - 1.0 mg/dL    Nitrite NEGATIVE  NEGATIVE    Leukocytes, UA NEGATIVE  NEGATIVE   URINE MICROSCOPIC-ADD ON     Status: Abnormal   Collection Time   08/16/12  4:58 AM      Component Value Range Comment   Squamous Epithelial / LPF MANY (*) RARE    WBC, UA 3-6  <3 WBC/hpf    RBC / HPF 21-50  <3 RBC/hpf    Bacteria, UA FEW (*) RARE   CBC WITH DIFFERENTIAL     Status: Abnormal   Collection Time   08/16/12  5:45 AM      Component Value Range Comment   WBC 10.7 (*) 4.0 - 10.5 K/uL    RBC 3.71 (*) 3.87 - 5.11 MIL/uL    Hemoglobin 10.5 (*) 12.0 - 15.0 g/dL    HCT 78.2 (*) 95.6 - 46.0 %    MCV 87.1  78.0 - 100.0 fL    MCH 28.3  26.0 - 34.0 pg    MCHC 32.5  30.0 - 36.0 g/dL    RDW 21.3  08.6 - 57.8 %    Platelets 238  150 - 400 K/uL    Neutrophils Relative 75  43 - 77 %    Neutro  Abs 8.1 (*) 1.7 - 7.7 K/uL    Lymphocytes Relative 15  12 - 46 %    Lymphs Abs 1.6  0.7 - 4.0 K/uL    Monocytes Relative 8  3 - 12 %    Monocytes Absolute 0.9  0.1 - 1.0 K/uL    Eosinophils Relative 1  0 - 5 %    Eosinophils Absolute 0.1  0.0 - 0.7 K/uL    Basophils Relative 0  0 - 1 %    Basophils Absolute 0.0  0.0 - 0.1 K/uL   COMPREHENSIVE METABOLIC PANEL     Status: Abnormal   Collection Time   08/16/12  5:45 AM      Component Value Range Comment   Sodium 134 (*) 135 - 145 mEq/L  Potassium 3.6  3.5 - 5.1 mEq/L    Chloride 100  96 - 112 mEq/L    CO2 22  19 - 32 mEq/L    Glucose, Bld 108 (*) 70 - 99 mg/dL    BUN 8  6 - 23 mg/dL    Creatinine, Ser 1.61  0.50 - 1.10 mg/dL    Calcium 8.9  8.4 - 09.6 mg/dL    Total Protein 6.0  6.0 - 8.3 g/dL    Albumin 2.7 (*) 3.5 - 5.2 g/dL    AST 13  0 - 37 U/L    ALT 10  0 - 35 U/L    Alkaline Phosphatase 150 (*) 39 - 117 U/L    Total Bilirubin 0.1 (*) 0.3 - 1.2 mg/dL    GFR calc non Af Amer >90  >90 mL/min    GFR calc Af Amer >90  >90 mL/min   TYPE AND SCREEN     Status: Normal   Collection Time   08/16/12 10:57 AM      Component Value Range Comment   ABO/RH(D) O POS      Antibody Screen NEG      Sample Expiration 08/19/2012       Assessment and Plan [redacted]w[redacted]d  Kidney stones with back pain No obstruction seen on [place] BPP 8/10 with normal fluid Continue IVF and pain management

## 2012-08-16 NOTE — MAU Provider Note (Signed)
History   31 yo G4P2012 at 34 weeks presented unannounced c/o recurrence of right flank pain--she was admitted on 08/13/12 with same symptoms, with right hydronephrosis noted on renal US.  Symptoms resolved that same day after IV hydration, and patient was d/c'd home with Vicodin and Macrobid.  She reports resumption of sx earlier in the evening, and no benefit from Vicodin.  Denies leaking, bleeding, or definite contractions--does feel some tightening sensations.  No N/V, dysuria, or frequency.  AFI was 8.1 on 9/19, but this was calculated via EDC of 09/12/12 (LMP EDC, but unsure LMP).  Correct EDC is 09/27/12 by 7 3/7 week [place] = 34 weeks.  Had negative urine culture 9/19, GC/chlamydia, and amnisure on 9/19.    History of present pregnancy: Patient entered care at 56 2/7 weeks.  EDC of 09/27/13 was established by [place] at 7 3/7 weeks.  Anatomy scan was done at 19 4/7 weeks, with normal findings.  She was admitted for observation on 9/19 for presumptive kidney stone, but was able to be discharged later that day.  Was sent home on Vicodin and Macrobid, but had resumption of symptoms last evening, refractory to pain medication.  While admitted that day, she had an AFI of 8.1 (5%ile), but this was calculated with EDC of 09/12/12, from an uncertain LMP.     Patient Active Problem List  Diagnosis  . HPV (human papilloma virus) infection  . Pregnant state, incidental  . Kidney stone - 2009  . Headache - frequent  . Bacterial vaginosis - 1st trimester  . Late prenatal care - 17wks  . Menses, irregular  . Kidney stone complicating pregnancy     Chief Complaint  Patient presents with  . Flank Pain    OB History    Grav Para Term Preterm Abortions TAB SAB Ect Mult Living   4 2 2  1  1   2       Past Medical History  Diagnosis Date  . HPV (human papilloma virus) infection   . Abnormal Pap smear 2000    COLPO X2; LAST PAP 2012  . Infection 2009    UTI   . Kidney stones 2009  . Headache    FREQUENT  . Fracture of right hand 2012    Past Surgical History  Procedure Date  . Colposcopy   . Appendectomy 2001    DURING PREGNANCY    Family History  Problem Relation Age of Onset  . Anesthesia problems Neg Hx   . Other Neg Hx   . Depression Son 9    AFTER DEATH OF FATHER  . COPD Paternal Grandfather     History  Substance Use Topics  . Smoking status: Never Smoker   . Smokeless tobacco: Never Used  . Alcohol Use: No     OCCASIONAL MIXED DRINK    Allergies: No Known Allergies  Prescriptions prior to admission  Medication Sig Dispense Refill  . HYDROcodone-acetaminophen (VICODIN) 5-500 MG per tablet Take 1 tablet by mouth every 4 (four) hours as needed for pain.  30 tablet  0  . nitrofurantoin, macrocrystal-monohydrate, (MACROBID) 100 MG capsule Take 1 capsule (100 mg total) by mouth 2 (two) times daily.  14 capsule  0  . calcium carbonate (TUMS - DOSED IN MG ELEMENTAL CALCIUM) 500 MG chewable tablet Chew 2 tablets (400 mg of elemental calcium total) by mouth daily.      . Prenatal Multivit-Min-Fe-FA (PRENATAL VITAMINS) 0.8 MG tablet Take 1 tablet by mouth daily.  30 tablet  12     Physical Exam   Temperature 98.2 F (36.8 C), temperature source Oral, resp. rate 24, last menstrual period 12/11/2011.  Chest clear Heart RRR without murmur Abd gravid, NT Pelvic--cervix ext os 1 cm, internal os closed, 50%, vtx, -2. Back--right CVAT Ext WNL  FHR reactive, no decels UCs none  Results for orders placed during the hospital encounter of 08/16/12 (from the past 24 hour(s))  URINALYSIS, ROUTINE W REFLEX MICROSCOPIC     Status: Abnormal   Collection Time   08/16/12  4:58 AM      Component Value Range   Color, Urine YELLOW  YELLOW   APPearance CLOUDY (*) CLEAR   Specific Gravity, Urine 1.025  1.005 - 1.030   pH 6.5  5.0 - 8.0   Glucose, UA NEGATIVE  NEGATIVE mg/dL   Hgb urine dipstick LARGE (*) NEGATIVE   Bilirubin Urine NEGATIVE  NEGATIVE   Ketones, ur  NEGATIVE  NEGATIVE mg/dL   Protein, ur 30 (*) NEGATIVE mg/dL   Urobilinogen, UA 0.2  0.0 - 1.0 mg/dL   Nitrite NEGATIVE  NEGATIVE   Leukocytes, UA NEGATIVE  NEGATIVE  URINE MICROSCOPIC-ADD ON     Status: Abnormal   Collection Time   08/16/12  4:58 AM      Component Value Range   Squamous Epithelial / LPF MANY (*) RARE   WBC, UA 3-6  <3 WBC/hpf   RBC / HPF 21-50  <3 RBC/hpf   Bacteria, UA FEW (*) RARE  CBC WITH DIFFERENTIAL     Status: Abnormal   Collection Time   08/16/12  5:45 AM      Component Value Range   WBC 10.7 (*) 4.0 - 10.5 K/uL   RBC 3.71 (*) 3.87 - 5.11 MIL/uL   Hemoglobin 10.5 (*) 12.0 - 15.0 g/dL   HCT 16.1 (*) 09.6 - 04.5 %   MCV 87.1  78.0 - 100.0 fL   MCH 28.3  26.0 - 34.0 pg   MCHC 32.5  30.0 - 36.0 g/dL   RDW 40.9  81.1 - 91.4 %   Platelets 238  150 - 400 K/uL   Neutrophils Relative 75  43 - 77 %   Neutro Abs 8.1 (*) 1.7 - 7.7 K/uL   Lymphocytes Relative 15  12 - 46 %   Lymphs Abs 1.6  0.7 - 4.0 K/uL   Monocytes Relative 8  3 - 12 %   Monocytes Absolute 0.9  0.1 - 1.0 K/uL   Eosinophils Relative 1  0 - 5 %   Eosinophils Absolute 0.1  0.0 - 0.7 K/uL   Basophils Relative 0  0 - 1 %   Basophils Absolute 0.0  0.0 - 0.1 K/uL  COMPREHENSIVE METABOLIC PANEL     Status: Abnormal   Collection Time   08/16/12  5:45 AM      Component Value Range   Sodium 134 (*) 135 - 145 mEq/L   Potassium 3.6  3.5 - 5.1 mEq/L   Chloride 100  96 - 112 mEq/L   CO2 22  19 - 32 mEq/L   Glucose, Bld 108 (*) 70 - 99 mg/dL   BUN 8  6 - 23 mg/dL   Creatinine, Ser 7.82  0.50 - 1.10 mg/dL   Calcium 8.9  8.4 - 95.6 mg/dL   Total Protein 6.0  6.0 - 8.3 g/dL   Albumin 2.7 (*) 3.5 - 5.2 g/dL   AST 13  0 - 37 U/L  ALT 10  0 - 35 U/L   Alkaline Phosphatase 150 (*) 39 - 117 U/L   Total Bilirubin 0.1 (*) 0.3 - 1.2 mg/dL   GFR calc non Af Amer >90  >90 mL/min   GFR calc Af Amer >90  >90 mL/min     ED Course  IUP at 34 weeks Probable right kidney stone--recurrence of sx AFI of 8  08/13/12, but ? Use of inaccurate EDC  Plan: IV hydration CBC, CMP, UA Pain medication--Dilaudid IV Anti-emetic prn Strain urine Will consult with Dr. Normand Sloop regarding f/u on ? Decreased AFI.  Per consult with Dr. Normand Sloop, plan admission for IV hydration, pain medication, repeat renal US, and OB US for growth, fluid, and BPP. Renal consult prn after renal US is reviewed.  Nigel Bridgeman [name], [place] 08/16/2012 5:57 AM

## 2012-08-17 ENCOUNTER — Other Ambulatory Visit (HOSPITAL_COMMUNITY): Payer: Medicaid Other

## 2012-08-17 ENCOUNTER — Inpatient Hospital Stay (HOSPITAL_COMMUNITY)
Admit: 2012-08-17 | Discharge: 2012-08-17 | Disposition: A | Discharge status: Home / Self Care | Payer: Medicaid Other | Attending: Obstetrics and Gynecology | Admitting: Obstetrics and Gynecology

## 2012-08-17 DIAGNOSIS — O0930 Supervision of pregnancy with insufficient antenatal care, unspecified trimester: Secondary | ICD-10-CM

## 2012-08-17 DIAGNOSIS — R109 Unspecified abdominal pain: Secondary | ICD-10-CM

## 2012-08-17 DIAGNOSIS — N200 Calculus of kidney: Secondary | ICD-10-CM

## 2012-08-17 DIAGNOSIS — O98519 Other viral diseases complicating pregnancy, unspecified trimester: Secondary | ICD-10-CM

## 2012-08-17 LAB — URINE CULTURE
Colony Count: 3000
Special Requests: NORMAL

## 2012-08-17 MED ORDER — SENNOSIDES-DOCUSATE SODIUM 8.6-50 MG PO TABS
2.0000 | ORAL_TABLET | Freq: Every day | ORAL | Status: DC
  Administered 2012-08-17: 2 via ORAL

## 2012-08-17 MED ORDER — HYDROMORPHONE HCL 2 MG PO TABS
2.0000 mg | ORAL_TABLET | ORAL | Status: DC | PRN
  Administered 2012-08-17 – 2012-08-18 (×5): 2 mg via ORAL
  Filled 2012-08-17 (×5): qty 1

## 2012-08-17 NOTE — Progress Notes (Signed)
Pt transported to [facility] via [facility] - vitals stable and fhr wnl prior to transfer. Radiology expecting patient for MRI.

## 2012-08-17 NOTE — Consult Note (Addendum)
Consult requested by Dr. Estanislado Pandy Diagnosis: right hydronephrosis and flank pain  History of Present Illness: 31 yo admitted with recurrent right flank pain. She is at 34 weeks. C/o recurrence of right flank pain--she was admitted on 08/13/12 with same symptoms, with right hydronephrosis noted on renal US. Symptoms resolved that same day after IV hydration, and patient was d/c'd home with Vicodin and Macrobid. She reports resumption of sx earlier yesterday evening, and no benefit from Vicodin.   Denies leaking, bleeding, or definite contractions--does feel some tightening sensations. No N/V, dysuria, or frequency.    Had negative urine culture 9/19, GC/chlamydia, and amnisure on 9/19.   Her pain is improved this evening. She has not had pain medicine in 6 hours.   She has a history of stones and passed one on the left in 2010. On the 2010 CT she had a small 2 mm stone in right kidney. Current Renal US showed right hydro and hydroureter with bilateral ureteral jets. MRI shows tapering of right ureter at the iliacs c/w compression. I reviewed all these images.   Past Medical History  Diagnosis Date  . HPV (human papilloma virus) infection   . Abnormal Pap smear 2000    COLPO X2; LAST PAP 2012  . Infection 2009    UTI   . Kidney stones 2009  . Headache     FREQUENT  . Fracture of right hand 2012   Past Surgical History  Procedure Date  . Colposcopy   . Appendectomy 2001    DURING PREGNANCY    Home Medications:  Prescriptions prior to admission  Medication Sig Dispense Refill  . calcium carbonate (TUMS - DOSED IN MG ELEMENTAL CALCIUM) 500 MG chewable tablet Chew 2 tablets (400 mg of elemental calcium total) by mouth daily.      Marland Kitchen HYDROcodone-acetaminophen (VICODIN) 5-500 MG per tablet Take 1 tablet by mouth every 4 (four) hours as needed for pain.  30 tablet  0  . nitrofurantoin, macrocrystal-monohydrate, (MACROBID) 100 MG capsule Take 1 capsule (100 mg total) by mouth 2 (two)  times daily.  14 capsule  0  . Prenatal Multivit-Min-Fe-FA (PRENATAL VITAMINS) 0.8 MG tablet Take 1 tablet by mouth daily.  30 tablet  12   Allergies: No Known Allergies  Family History  Problem Relation Age of Onset  . Anesthesia problems Neg Hx   . Other Neg Hx   . Depression Son 9    AFTER DEATH OF FATHER  . COPD Paternal Grandfather    Social History:  reports that she has never smoked. She has never used smokeless tobacco. She reports that she does not drink alcohol or use illicit drugs.  ROS: A complete review of systems was performed.  All systems are negative except for pertinent findings as noted. @ROS @   Physical Exam:  Pt's nurse was in room as chaperone. Vital signs in last 24 hours: Temp:  [97.9 F (36.6 C)-98.6 F (37 C)] 98.2 F (36.8 C) (09/23 2004) Pulse Rate:  [83-93] 89  (09/23 2004) Resp:  [18-20] 20  (09/23 2004) BP: (102-110)/(56-68) 103/56 mmHg (09/23 2004) General:  Alert and oriented, No acute distress HEENT: Normocephalic, atraumatic Neck: No JVD or lymphadenopathy Cardiovascular: Regular rate and rhythm Lungs: Regular rate and effort Abdomen: Soft, nontender Back: No CVA tenderness Extremities: No edema Neurologic: Grossly intact  Laboratory Data:  No results found for this or any previous visit (from the past 24 hour(s)). Recent Results (from the past 240 hour(s))  WET  PREP, GENITAL     Status: Abnormal   Collection Time   08/13/12  2:40 AM      Component Value Range Status Comment   Yeast Wet Prep HPF POC NONE SEEN  NONE SEEN Final    Trich, Wet Prep NONE SEEN  NONE SEEN Final    Clue Cells Wet Prep HPF POC NONE SEEN  NONE SEEN Final    WBC, Wet Prep HPF POC FEW (*) NONE SEEN Final FEW BACTERIA SEEN  URINE CULTURE     Status: Normal   Collection Time   08/13/12  2:50 AM      Component Value Range Status Comment   Specimen Description URINE, RANDOM   Final    Special Requests NONE   Final    Culture  Setup Time 08/13/2012 16:12    Final    Colony Count 4,000 COLONIES/ML   Final    Culture INSIGNIFICANT GROWTH   Final    Report Status 08/14/2012 FINAL   Final   URINE CULTURE     Status: Normal   Collection Time   08/16/12  4:58 AM      Component Value Range Status Comment   Specimen Description OB CLEAN CATCH   Final    Special Requests Macrobid Normal   Final    Culture  Setup Time 08/16/2012 11:37   Final    Colony Count 3,000 COLONIES/ML   Final    Culture     Final    Value: INSIGNIFICANT GROWTH     Note: NO GROUP B STREP (S.AGALACTIAE) ISOLATED   Report Status 08/17/2012 FINAL   Final    Creatinine:  Basename 08/16/12 0545 08/13/12 0305  CREATININE 0.57 0.73    Impression/Assessment:  -right hydronephrosis down to tapering at the iliac crest.  -right flank pain -AFVSS, normal CBC and BMP, negative urine culture -- no urgent intervention needed.   Plan:  -I discussed the [place] and MRI findings with the patient and the limitations these studies have in visualizing ureteral or renal stones and that none of these studies definitively prove the cause of her hydronephrosis -we discussed the nature, risks and benefits of continued supportive care, CT scan (fetal demise and birth defects among others), cysto possible right ureteroscopy /right stent placement (stent change q 4 weeks, stent pain, failure to gain retrograde access, ureteral injury among others) or Nephrostomy tube (infection, bleeding among others).  -Pt said she thought she would be able to go home on po Dilaudid. She said she would proceed with a CT if needed as she had a CT with her other pregnancy (probably for similar symptoms as there was concern of an "appendicitis"). She did not want to proceed with stent or Nx tube. -CT may be reasonable if she has continued pain as she does have a history of passing stones and stones on CT in the past few years. -I will follow  Antony Haste 08/17/2012, 8:34 PM

## 2012-08-17 NOTE — Progress Notes (Addendum)
[name] [name] is a 31 y.o. [id number] at [redacted]w[redacted]d Reason for Admission:21615} Patient Active Problem List  Diagnosis  . HPV (human papilloma virus) infection  . Pregnant state, incidental  . Kidney stone - 2010  . Headache - frequent  . Bacterial vaginosis - 1st trimester  . Late prenatal care - 17wks  . Menses, irregular  . Kidney stone complicating pregnancy    Subjective: denies srom, vag bleeding, uc, with +FM, with R side back pain steady ache worse at times  Objective: BP 102/62  Pulse 84  Temp 97.9 F (36.6 C) (Oral)  Resp 18  Ht 5\' 4"  (1.626 m)  Wt 144 lb (65.318 kg)  BMI 24.72 kg/m2  LMP 12/11/2011      Physical Exam:  Gen: calm, no distress Chest/Lungs: cta bilaterally  Heart/Pulse: RRR  Abdomen: soft, gravid, nontender, BX x4 quad Uterine fundus: soft, nontender Skin & Color: warm and dry  EXT: negative Homan's b/l, edema no  FHT:  LTV mod accels,  UC:  rare SVE:   Dilation: Closed Exam by:: V Lathum  Labs: Lab Results  Component Value Date   WBC 10.7* 08/16/2012   HGB 10.5* 08/16/2012   HCT 32.3* 08/16/2012   MCV 87.1 08/16/2012   PLT 238 08/16/2012    Assessment and Plan: has HPV (human papilloma virus) infection; Pregnant state, incidental; Kidney stone - 2010; Headache - frequent; Bacterial vaginosis - 1st trimester; Late prenatal care - 17wks; Menses, irregular; and Kidney stone complicating pregnancy on her problem list. Kidney stones with back pain  No obstruction seen on [place]  Continue IVF and pain management  NSt q shift, stool softner, may have ice or heat to R back, po dilaudid discussed.  Lavera Guise, [name] 08/17/2012, 9:09 AM     Pt seen and plan of care reviewed Has just received po Dilaudid with minimal relief. Will give IV Dilaudid. This is 2nd admission during this pregnancy. Called Dr Mena Goes (urology) for in-patient consultation. Discussed imaging. Dr Mena Goes is recommending further imaging to confirm ureteral stone.  Will order MRI of abdomen and pelvis. He will see patient after

## 2012-08-17 NOTE — Progress Notes (Signed)
rn spoke with dr Mena Goes - nephrology - will plan to come to see pt this evening between 8-9pm. No new orders received or change in the poc.

## 2012-08-17 NOTE — Progress Notes (Signed)
Ur chart review completed.  

## 2012-08-18 ENCOUNTER — Other Ambulatory Visit (HOSPITAL_COMMUNITY): Payer: Medicaid Other

## 2012-08-18 DIAGNOSIS — R319 Hematuria, unspecified: Secondary | ICD-10-CM

## 2012-08-18 DIAGNOSIS — N200 Calculus of kidney: Secondary | ICD-10-CM

## 2012-08-18 DIAGNOSIS — O98519 Other viral diseases complicating pregnancy, unspecified trimester: Secondary | ICD-10-CM

## 2012-08-18 DIAGNOSIS — R109 Unspecified abdominal pain: Secondary | ICD-10-CM

## 2012-08-18 MED ORDER — POLYETHYLENE GLYCOL 3350 17 G PO PACK
17.0000 g | PACK | Freq: Every day | ORAL | Status: DC
Start: 2012-08-18 — End: 2012-08-18
  Administered 2012-08-18: 17 g via ORAL
  Filled 2012-08-18 (×2): qty 1

## 2012-08-18 MED ORDER — POLYETHYLENE GLYCOL 3350 17 G PO PACK: 17.0000 g | PACK | Freq: Every day | ORAL | Status: DC

## 2012-08-18 MED ORDER — CYCLOBENZAPRINE HCL 10 MG PO TABS: 5.0000 mg | ORAL_TABLET | Freq: Three times a day (TID) | ORAL | Status: DC | PRN

## 2012-08-18 MED ORDER — HYDROMORPHONE HCL 2 MG PO TABS: 2.0000 mg | ORAL_TABLET | ORAL | Status: DC | PRN

## 2012-08-18 MED ORDER — CYCLOBENZAPRINE HCL 5 MG PO TABS: 5.0000 mg | ORAL_TABLET | Freq: Three times a day (TID) | ORAL | Status: DC | PRN

## 2012-08-18 NOTE — Discharge Summary (Addendum)
Physician Discharge Summary  Patient ID: [name] [name] MRN: [id number] DOB/AGE: August 31, 1981 30 y.o.  Admit date: 08/16/2012 Discharge date: 08/18/2012  Admission Diagnoses: Pregnancy at [redacted]w[redacted]d     Right flank pain     Right kidney stone  Discharge Diagnoses: Same Principal Problem:  *Kidney stone complicating pregnancy   Discharged Condition: stable  Hospital Course: Patient was admitted and underwent IV hydration and analgesics.  At the request of the Urology consultant, she underwent an MRI with the finding of no ureteral stone.  He outlined in detail for the patient her options for management with the risks and benefits of each option.  These are detailed in his consult note.  She has received pain relief from oral dilaudid and wishes to be discharged home on that medication . She requested management for constipation and will use Miralax daily while on Dilaudid  Consults: urology  Significant Diagnostic Studies: microbiology: urine culture: negative and radiology: MRI: no ureteral stone  Treatments: IV hydration, antibiotics: marcrobid and analgesia: Dilaudid  Discharge Exam: Blood pressure 105/64, pulse 82, temperature 97.6 F (36.4 C), temperature source Oral, resp. rate 18, height 5\' 4"  (1.626 m), weight 144 lb (65.318 kg), last menstrual period 12/11/2011. General appearance: alert, cooperative, appears stated age and mild distress Back: mild tenderness on percussuion on right NST reactive Disposition: 01-Home or Self Care  Discharge Orders    Future Appointments: Provider: Department: Dept Phone: Center:   08/21/2012 1:00 PM Esmeralda Arthur, MD [facility] [phone] None       Medication List     As of 08/18/2012 11:31 AM    STOP taking these medications         calcium carbonate 500 MG chewable tablet   Commonly known as: TUMS - dosed in mg elemental calcium      HYDROcodone-acetaminophen 5-500 MG per tablet   Commonly known as: VICODIN     nitrofurantoin (macrocrystal-monohydrate) 100 MG capsule   Commonly known as: MACROBID      TAKE these medications         cyclobenzaprine 5 MG tablet   Commonly known as: FLEXERIL   Take 1 tablet (5 mg total) by mouth 3 (three) times daily as needed for muscle spasms.      HYDROmorphone 2 MG tablet   Commonly known as: DILAUDID   Take 1 tablet (2 mg total) by mouth every 4 (four) hours as needed for pain.      polyethylene glycol packet   Commonly known as: MIRALAX / GLYCOLAX   Take 17 g by mouth daily.      PRENATAL VITAMINS 0.8 MG tablet   Take 1 tablet by mouth daily.           Follow-up Information    Follow up with [facility] A, MD. On 08/20/2012. (pt has appt)    Contact information:   [address] [place] [address] [phone]          Signed: Hal Morales 08/18/2012, 11:31 AM

## 2012-08-21 ENCOUNTER — Encounter: Payer: Self-pay | Admitting: Obstetrics and Gynecology

## 2012-08-21 ENCOUNTER — Ambulatory Visit (INDEPENDENT_AMBULATORY_CARE_PROVIDER_SITE_OTHER): Payer: Medicaid Other | Admitting: Obstetrics and Gynecology

## 2012-08-21 VITALS — BP 102/62 | Wt 144.0 lb

## 2012-08-21 DIAGNOSIS — Z331 Pregnant state, incidental: Secondary | ICD-10-CM

## 2012-08-21 NOTE — Progress Notes (Signed)
[redacted]w[redacted]d Well pain well managed with PO Dilaudid and Flexeril every 4-6 hours GFM no other complaints Growth ultrasound 08/16/12: AGA  6 lbs with normal AFI despite S<D

## 2012-08-21 NOTE — Progress Notes (Signed)
[redacted]w[redacted]d No complaints today

## 2012-08-28 ENCOUNTER — Encounter: Payer: Self-pay | Admitting: Obstetrics and Gynecology

## 2012-08-28 ENCOUNTER — Ambulatory Visit (INDEPENDENT_AMBULATORY_CARE_PROVIDER_SITE_OTHER): Payer: Medicaid Other | Admitting: Obstetrics and Gynecology

## 2012-08-28 VITALS — BP 100/62 | Wt 146.0 lb

## 2012-08-28 DIAGNOSIS — Z331 Pregnant state, incidental: Secondary | ICD-10-CM

## 2012-08-28 MED ORDER — HYDROMORPHONE HCL 2 MG PO TABS: 2.0000 mg | ORAL_TABLET | ORAL | Status: DC | PRN

## 2012-08-28 NOTE — Progress Notes (Signed)
[redacted]w[redacted]d Back pain is well controlled with Dilaudid used once daily and occ Flexeril Dilaudid prescription refilled #30

## 2012-08-28 NOTE — Progress Notes (Signed)
[redacted]w[redacted]d PT STATES SHE HAS A LITTLE BACK PAIN.PT GOT FLU SHOT AT [facility] ON 08/13/12.

## 2012-09-04 ENCOUNTER — Ambulatory Visit (INDEPENDENT_AMBULATORY_CARE_PROVIDER_SITE_OTHER): Payer: Medicaid Other | Admitting: Obstetrics and Gynecology

## 2012-09-04 ENCOUNTER — Encounter: Payer: Self-pay | Admitting: Obstetrics and Gynecology

## 2012-09-04 VITALS — BP 100/60 | Wt 148.0 lb

## 2012-09-04 DIAGNOSIS — Z331 Pregnant state, incidental: Secondary | ICD-10-CM

## 2012-09-04 NOTE — Progress Notes (Signed)
Patient ID: [name] [name], female   DOB: [birth year], 31 y.o.   MRN: [id number] [redacted]w[redacted]d Dilaudid or flexeril about once a day for back pain so much better than before Reviewed s/s uc, srom, vag bleeding, daily fetal kick counts to report, comfort measures. Encouragged 8 water daily and frequent voids. Lavera Guise, [name]

## 2012-09-04 NOTE — Progress Notes (Signed)
Pt c/o pressure and some back pain. GBS today

## 2012-09-09 ENCOUNTER — Ambulatory Visit (INDEPENDENT_AMBULATORY_CARE_PROVIDER_SITE_OTHER): Payer: Medicaid Other | Admitting: Obstetrics and Gynecology

## 2012-09-09 VITALS — BP 100/58 | Wt 149.0 lb

## 2012-09-09 DIAGNOSIS — Z331 Pregnant state, incidental: Secondary | ICD-10-CM

## 2012-09-09 NOTE — Progress Notes (Signed)
Pt stated been having a lot of pressure when walking and sitting. Pt wants a cervix check today. Pt stated no other issues today.

## 2012-09-09 NOTE — Progress Notes (Signed)
[redacted]w[redacted]d GFM back pain resolved

## 2012-09-10 ENCOUNTER — Encounter: Payer: Medicaid Other | Admitting: Obstetrics and Gynecology

## 2012-09-18 ENCOUNTER — Ambulatory Visit (INDEPENDENT_AMBULATORY_CARE_PROVIDER_SITE_OTHER): Payer: Medicaid Other | Admitting: Obstetrics and Gynecology

## 2012-09-18 VITALS — BP 108/60 | Wt 151.0 lb

## 2012-09-18 DIAGNOSIS — Z331 Pregnant state, incidental: Secondary | ICD-10-CM

## 2012-09-18 NOTE — Progress Notes (Signed)
[redacted]w[redacted]d  Pt has no concerns today. Pt has already received Flu vaccine.

## 2012-09-18 NOTE — Progress Notes (Signed)
[redacted]w[redacted]d No complaints No recurrence of back pain

## 2012-09-21 ENCOUNTER — Inpatient Hospital Stay (HOSPITAL_COMMUNITY)
Admission: AD | Admit: 2012-09-21 | Discharge: 2012-09-24 | DRG: 775 | Disposition: A | Discharge status: Home / Self Care | Payer: Medicaid Other | Source: Ambulatory Visit | Attending: Obstetrics and Gynecology | Admitting: Obstetrics and Gynecology

## 2012-09-21 ENCOUNTER — Encounter (HOSPITAL_COMMUNITY): Payer: Self-pay | Admitting: *Deleted

## 2012-09-21 DIAGNOSIS — R51 Headache: Secondary | ICD-10-CM

## 2012-09-21 DIAGNOSIS — O0930 Supervision of pregnancy with insufficient antenatal care, unspecified trimester: Secondary | ICD-10-CM

## 2012-09-21 DIAGNOSIS — N926 Irregular menstruation, unspecified: Secondary | ICD-10-CM

## 2012-09-21 DIAGNOSIS — B977 Papillomavirus as the cause of diseases classified elsewhere: Secondary | ICD-10-CM

## 2012-09-21 DIAGNOSIS — N200 Calculus of kidney: Secondary | ICD-10-CM

## 2012-09-21 DIAGNOSIS — D649 Anemia, unspecified: Secondary | ICD-10-CM

## 2012-09-21 DIAGNOSIS — B9689 Other specified bacterial agents as the cause of diseases classified elsewhere: Secondary | ICD-10-CM

## 2012-09-21 DIAGNOSIS — Z331 Pregnant state, incidental: Secondary | ICD-10-CM

## 2012-09-21 DIAGNOSIS — O9903 Anemia complicating the puerperium: Secondary | ICD-10-CM | POA: Diagnosis not present

## 2012-09-21 HISTORY — DX: Anemia, unspecified: D64.9

## 2012-09-21 MED ORDER — NALBUPHINE SYRINGE 5 MG/0.5 ML
10.0000 mg | INJECTION | Freq: Once | INTRAMUSCULAR | Status: AC
  Administered 2012-09-21: 10 mg via SUBCUTANEOUS
  Filled 2012-09-21: qty 1

## 2012-09-21 NOTE — MAU Note (Signed)
contractions 

## 2012-09-21 NOTE — MAU Note (Signed)
Pt requesting pain medication.  

## 2012-09-21 NOTE — MAU Note (Signed)
Eustace Pen [name] at the bedside to discuss medication options.

## 2012-09-22 ENCOUNTER — Encounter (HOSPITAL_COMMUNITY): Payer: Self-pay

## 2012-09-22 ENCOUNTER — Encounter (HOSPITAL_COMMUNITY): Payer: Self-pay | Admitting: Anesthesiology

## 2012-09-22 ENCOUNTER — Inpatient Hospital Stay (HOSPITAL_COMMUNITY): Payer: Medicaid Other | Admitting: Anesthesiology

## 2012-09-22 ENCOUNTER — Encounter (HOSPITAL_COMMUNITY): Payer: Self-pay | Admitting: *Deleted

## 2012-09-22 LAB — CBC
HCT: 29.8 % — ABNORMAL LOW (ref 36.0–46.0)
Hemoglobin: 9.7 g/dL — ABNORMAL LOW (ref 12.0–15.0)
MCH: 27.3 pg (ref 26.0–34.0)
MCHC: 32.6 g/dL (ref 30.0–36.0)
MCV: 83.9 fL (ref 78.0–100.0)
RBC: 3.55 MIL/uL — ABNORMAL LOW (ref 3.87–5.11)

## 2012-09-22 LAB — RPR: RPR Ser Ql: NONREACTIVE

## 2012-09-22 MED ORDER — EPHEDRINE 5 MG/ML INJ: 10.0000 mg | INTRAVENOUS | Status: DC | PRN

## 2012-09-22 MED ORDER — TETANUS-DIPHTH-ACELL PERTUSSIS 5-2.5-18.5 LF-MCG/0.5 IM SUSP
0.5000 mL | Freq: Once | INTRAMUSCULAR | Status: AC
  Administered 2012-09-23: 0.5 mL via INTRAMUSCULAR
  Filled 2012-09-22: qty 0.5

## 2012-09-22 MED ORDER — FLEET ENEMA 7-19 GM/118ML RE ENEM: 1.0000 | ENEMA | RECTAL | Status: DC | PRN

## 2012-09-22 MED ORDER — LANOLIN HYDROUS EX OINT: TOPICAL_OINTMENT | CUTANEOUS | Status: DC | PRN

## 2012-09-22 MED ORDER — BENZOCAINE-MENTHOL 20-0.5 % EX AERO
1.0000 "application " | INHALATION_SPRAY | CUTANEOUS | Status: DC | PRN
  Administered 2012-09-22: 1 via TOPICAL
  Filled 2012-09-22: qty 56

## 2012-09-22 MED ORDER — DIPHENHYDRAMINE HCL 25 MG PO CAPS: 25.0000 mg | ORAL_CAPSULE | Freq: Four times a day (QID) | ORAL | Status: DC | PRN

## 2012-09-22 MED ORDER — BISACODYL 10 MG RE SUPP: 10.0000 mg | Freq: Every day | RECTAL | Status: DC | PRN

## 2012-09-22 MED ORDER — WITCH HAZEL-GLYCERIN EX PADS: 1.0000 "application " | MEDICATED_PAD | CUTANEOUS | Status: DC | PRN

## 2012-09-22 MED ORDER — IBUPROFEN 600 MG PO TABS
600.0000 mg | ORAL_TABLET | Freq: Four times a day (QID) | ORAL | Status: DC | PRN
  Administered 2012-09-22: 600 mg via ORAL
  Filled 2012-09-22: qty 1

## 2012-09-22 MED ORDER — ONDANSETRON HCL 4 MG/2ML IJ SOLN: 4.0000 mg | Freq: Four times a day (QID) | INTRAMUSCULAR | Status: DC | PRN

## 2012-09-22 MED ORDER — PHENYLEPHRINE 40 MCG/ML (10ML) SYRINGE FOR IV PUSH (FOR BLOOD PRESSURE SUPPORT): 80.0000 ug | PREFILLED_SYRINGE | INTRAVENOUS | Status: DC | PRN

## 2012-09-22 MED ORDER — CITRIC ACID-SODIUM CITRATE 334-500 MG/5ML PO SOLN
30.0000 mL | ORAL | Status: DC | PRN
  Filled 2012-09-22: qty 15

## 2012-09-22 MED ORDER — FENTANYL 2.5 MCG/ML BUPIVACAINE 1/10 % EPIDURAL INFUSION (WH - ANES)
INTRAMUSCULAR | Status: DC | PRN
  Administered 2012-09-22: 14 mL/h via EPIDURAL

## 2012-09-22 MED ORDER — ACETAMINOPHEN 325 MG PO TABS: 650.0000 mg | ORAL_TABLET | ORAL | Status: DC | PRN

## 2012-09-22 MED ORDER — ONDANSETRON HCL 4 MG/2ML IJ SOLN: 4.0000 mg | INTRAMUSCULAR | Status: DC | PRN

## 2012-09-22 MED ORDER — OXYTOCIN BOLUS FROM INFUSION
500.0000 mL | Freq: Once | INTRAVENOUS | Status: AC
  Administered 2012-09-22: 500 mL via INTRAVENOUS
  Filled 2012-09-22: qty 500

## 2012-09-22 MED ORDER — FLEET ENEMA 7-19 GM/118ML RE ENEM: 1.0000 | ENEMA | Freq: Every day | RECTAL | Status: DC | PRN

## 2012-09-22 MED ORDER — ONDANSETRON HCL 4 MG PO TABS: 4.0000 mg | ORAL_TABLET | ORAL | Status: DC | PRN

## 2012-09-22 MED ORDER — OXYTOCIN 40 UNITS IN LACTATED RINGERS INFUSION - SIMPLE MED
62.5000 mL/h | INTRAVENOUS | Status: DC
  Filled 2012-09-22: qty 1000

## 2012-09-22 MED ORDER — IBUPROFEN 600 MG PO TABS
600.0000 mg | ORAL_TABLET | Freq: Four times a day (QID) | ORAL | Status: DC
  Administered 2012-09-22 – 2012-09-24 (×7): 600 mg via ORAL
  Filled 2012-09-22 (×7): qty 1

## 2012-09-22 MED ORDER — LACTATED RINGERS IV SOLN
INTRAVENOUS | Status: DC
  Administered 2012-09-22 (×2): via INTRAVENOUS

## 2012-09-22 MED ORDER — EPHEDRINE 5 MG/ML INJ
10.0000 mg | INTRAVENOUS | Status: DC | PRN
  Filled 2012-09-22: qty 4

## 2012-09-22 MED ORDER — LIDOCAINE HCL (PF) 1 % IJ SOLN
30.0000 mL | INTRAMUSCULAR | Status: DC | PRN
  Administered 2012-09-22: 30 mL via SUBCUTANEOUS
  Filled 2012-09-22: qty 30

## 2012-09-22 MED ORDER — LACTATED RINGERS IV SOLN
500.0000 mL | Freq: Once | INTRAVENOUS | Status: AC
  Administered 2012-09-22: 500 mL via INTRAVENOUS

## 2012-09-22 MED ORDER — SIMETHICONE 80 MG PO CHEW: 80.0000 mg | CHEWABLE_TABLET | ORAL | Status: DC | PRN

## 2012-09-22 MED ORDER — PRENATAL MULTIVITAMIN CH
1.0000 | ORAL_TABLET | Freq: Every day | ORAL | Status: DC
  Administered 2012-09-22 – 2012-09-24 (×3): 1 via ORAL
  Filled 2012-09-22 (×3): qty 1

## 2012-09-22 MED ORDER — MEASLES, MUMPS & RUBELLA VAC ~~LOC~~ INJ
0.5000 mL | INJECTION | Freq: Once | SUBCUTANEOUS | Status: DC
  Filled 2012-09-22: qty 0.5

## 2012-09-22 MED ORDER — LIDOCAINE HCL (PF) 1 % IJ SOLN
INTRAMUSCULAR | Status: DC | PRN
  Administered 2012-09-22: 6 mL
  Administered 2012-09-22: 8 mL

## 2012-09-22 MED ORDER — DIPHENHYDRAMINE HCL 50 MG/ML IJ SOLN: 12.5000 mg | INTRAMUSCULAR | Status: DC | PRN

## 2012-09-22 MED ORDER — SENNOSIDES-DOCUSATE SODIUM 8.6-50 MG PO TABS
2.0000 | ORAL_TABLET | Freq: Every day | ORAL | Status: DC
  Administered 2012-09-22 – 2012-09-23 (×2): 2 via ORAL

## 2012-09-22 MED ORDER — OXYCODONE-ACETAMINOPHEN 5-325 MG PO TABS
1.0000 | ORAL_TABLET | ORAL | Status: DC | PRN
  Administered 2012-09-22: 1 via ORAL
  Filled 2012-09-22: qty 1

## 2012-09-22 MED ORDER — FENTANYL 2.5 MCG/ML BUPIVACAINE 1/10 % EPIDURAL INFUSION (WH - ANES)
14.0000 mL/h | INTRAMUSCULAR | Status: DC
  Filled 2012-09-22: qty 125

## 2012-09-22 MED ORDER — OXYCODONE-ACETAMINOPHEN 5-325 MG PO TABS
1.0000 | ORAL_TABLET | ORAL | Status: DC | PRN
  Administered 2012-09-22 – 2012-09-23 (×6): 1 via ORAL
  Filled 2012-09-22 (×6): qty 1

## 2012-09-22 MED ORDER — ZOLPIDEM TARTRATE 5 MG PO TABS: 5.0000 mg | ORAL_TABLET | Freq: Every evening | ORAL | Status: DC | PRN

## 2012-09-22 MED ORDER — PHENYLEPHRINE 40 MCG/ML (10ML) SYRINGE FOR IV PUSH (FOR BLOOD PRESSURE SUPPORT)
80.0000 ug | PREFILLED_SYRINGE | INTRAVENOUS | Status: DC | PRN
  Filled 2012-09-22: qty 5

## 2012-09-22 MED ORDER — LACTATED RINGERS IV SOLN: 500.0000 mL | INTRAVENOUS | Status: DC | PRN

## 2012-09-22 MED ORDER — DIBUCAINE 1 % RE OINT: 1.0000 "application " | TOPICAL_OINTMENT | RECTAL | Status: DC | PRN

## 2012-09-22 NOTE — Plan of Care (Signed)
Problem: Consults Goal: Birthing Suites Patient Information Press F2 to bring up selections list Outcome: Completed/Met Date Met:  09/22/12  Pt 37-[redacted] weeks EGA

## 2012-09-22 NOTE — Anesthesia Postprocedure Evaluation (Signed)
  Anesthesia Post Note  Patient: [name] [name]  Procedure(s) Performed: * No procedures listed *  Anesthesia type: Epidural  Patient location: Mother/Baby  Post pain: Pain level controlled  Post assessment: Post-op Vital signs reviewed  Last Vitals:  Filed Vitals:   09/22/12 1453  BP: 99/66  Pulse: 85  Temp: 36.7 C  Resp: 18    Post vital signs: Reviewed  Level of consciousness:alert  Complications: No apparent anesthesia complications

## 2012-09-22 NOTE — Progress Notes (Signed)
Subjective: Comfortable w/ exception of intermittent pelvic pressure s/p epidural (received around 0230).   S.o. Asleep at bedside.  Objective: BP 133/77  Pulse 104  Temp 98.4 F (36.9 C) (Oral)  Resp 20  Ht 5' 2.99" (1.6 m)  Wt 149 lb (67.586 kg)  BMI 26.40 kg/m2  SpO2 100%  LMP 12/11/2011  Breastfeeding? Unknown      FHT:  FHR: 135 bpm, variability: moderate,  accelerations:  Present,  decelerations:  Absent UC:   regular, every 2-4 minutes SVE:   Dilation: 7 Effacement (%): 90 Station: -1 Exam by:: H. Cesar Alf, cnm AROM, large amt clear fluid; small amt of bloody show noted Labs: Lab Results  Component Value Date   WBC 14.1* 09/22/2012   HGB 9.7* 09/22/2012   HCT 29.8* 09/22/2012   MCV 83.9 09/22/2012   PLT 241 09/22/2012    Assessment / Plan: 1. [redacted]w[redacted]d 2. active labor 3. GBS neg  Labor: Progressing normally Preeclampsia:  no signs or symptoms of toxicity Fetal Wellbeing:  Category I Pain Control:  Epidural I/D:  n/a Anticipated MOD:  NSVD 1.  C/w MD prn  Brecklynn Jian H 09/22/2012, 3:28 AM

## 2012-09-22 NOTE — MAU Note (Signed)
Eustace Pen [name] notified of pt, orders rec'd.

## 2012-09-22 NOTE — Progress Notes (Signed)
UR Chart review completed.  

## 2012-09-22 NOTE — Anesthesia Procedure Notes (Signed)
Epidural Patient location during procedure: OB Start time: 09/22/2012 2:17 AM End time: 09/22/2012 2:20 AM  Staffing Anesthesiologist: Sandrea Hughs Performed by: anesthesiologist   Preanesthetic Checklist Completed: patient identified, site marked, surgical consent, pre-op evaluation, timeout performed, IV checked, risks and benefits discussed and monitors and equipment checked  Epidural Patient position: sitting Prep: site prepped and draped and DuraPrep Patient monitoring: continuous pulse ox and blood pressure Approach: midline Injection technique: LOR air  Needle:  Needle type: Tuohy  Needle gauge: 17 G Needle length: 9 cm and 9 Needle insertion depth: 5 cm cm Catheter type: closed end flexible Catheter size: 19 Gauge Catheter at skin depth: 10 cm Test dose: negative and Other  Assessment Sensory level: T8 Events: blood not aspirated, injection not painful, no injection resistance, negative IV test and no paresthesia  Additional Notes Reason for block:procedure for pain

## 2012-09-22 NOTE — Anesthesia Preprocedure Evaluation (Signed)
Anesthesia Evaluation  Patient identified by MRN, date of birth, ID band Patient awake    Reviewed: Allergy & Precautions, H&P , NPO status , Patient's Chart, lab work & pertinent test results  Airway Mallampati: I TM Distance: >3 FB Neck ROM: full    Dental No notable dental hx.    Pulmonary neg pulmonary ROS,    Pulmonary exam normal       Cardiovascular negative cardio ROS      Neuro/Psych negative psych ROS   GI/Hepatic negative GI ROS, Neg liver ROS,   Endo/Other  negative endocrine ROSHyperthyroidism   Renal/GU   negative genitourinary   Musculoskeletal negative musculoskeletal ROS (+)   Abdominal Normal abdominal exam  (+)   Peds negative pediatric ROS (+)  Hematology negative hematology ROS (+)   Anesthesia Other Findings   Reproductive/Obstetrics (+) Pregnancy                           Anesthesia Physical Anesthesia Plan  ASA: II  Anesthesia Plan: Epidural   Post-op Pain Management:    Induction:   Airway Management Planned:   Additional Equipment:   Intra-op Plan:   Post-operative Plan:   Informed Consent: I have reviewed the patients History and Physical, chart, labs and discussed the procedure including the risks, benefits and alternatives for the proposed anesthesia with the patient or authorized representative who has indicated his/her understanding and acceptance.     Plan Discussed with:   Anesthesia Plan Comments:         Anesthesia Quick Evaluation

## 2012-09-23 ENCOUNTER — Encounter: Payer: Medicaid Other | Admitting: Obstetrics and Gynecology

## 2012-09-23 ENCOUNTER — Encounter (HOSPITAL_COMMUNITY): Payer: Self-pay | Admitting: Obstetrics and Gynecology

## 2012-09-23 DIAGNOSIS — D649 Anemia, unspecified: Secondary | ICD-10-CM

## 2012-09-23 HISTORY — DX: Anemia, unspecified: D64.9

## 2012-09-23 LAB — CBC
Hemoglobin: 9.5 g/dL — ABNORMAL LOW (ref 12.0–15.0)
MCH: 27.2 pg (ref 26.0–34.0)
MCV: 84.2 fL (ref 78.0–100.0)
RBC: 3.49 MIL/uL — ABNORMAL LOW (ref 3.87–5.11)

## 2012-09-23 NOTE — Progress Notes (Addendum)
S: comfortable, little bleeding, slept     Feeding O BP 87/53  Pulse 69  Temp 97.7 F (36.5 C) (Oral)  Resp 18  Ht 5' 2.99" (1.6 m)  Wt 149 lb (67.586 kg)  BMI 26.40 kg/m2  SpO2 98%  LMP 12/11/2011  Breastfeeding? Unknown     abd soft, nt, ff      sm  Flow perineum clean intact periurethral lac and 1 degree perineal lac     -Homans sign bilaterally,      No Edema    Component Value Date/Time   HGB 9.5* 09/23/2012 0600   HCT 29.4* 09/23/2012 0600   A normal involution     nonlactating     PP day 1     anemia P plans mirena IUD dicussed, continue care Lavera Guise, [name]

## 2012-09-24 ENCOUNTER — Encounter (HOSPITAL_COMMUNITY): Payer: Self-pay | Admitting: *Deleted

## 2012-09-24 MED ORDER — IBUPROFEN 600 MG PO TABS: 600.0000 mg | ORAL_TABLET | Freq: Four times a day (QID) | ORAL | Status: DC | PRN

## 2012-09-24 MED ORDER — OXYCODONE-ACETAMINOPHEN 5-325 MG PO TABS: 1.0000 | ORAL_TABLET | ORAL | Status: DC | PRN

## 2012-09-24 NOTE — Discharge Summary (Signed)
Physician Discharge Summary  Patient ID: [name] [name] MRN: [id number] DOB/AGE: [birth year] 31 y.o.  Admit date: 09/21/2012 Discharge date: 09/24/2012  Admission Diagnoses: [redacted]w[redacted]d labor   Discharge Diagnoses:  Active Problems:  NSVD (normal spontaneous vaginal delivery)  First degree perineal laceration  Laceration of periurethral tissue with delivery  Anemia   Discharged Condition: stable  Hospital Course: [redacted]w[redacted]d labor, SVD, periuretheral lac, 1 degree perineal lac, normal involution, anemia, lacatating, plans mirena IUD   Consults: None  Significant Diagnostic Studies: labs:  Hemoglobin & Hematocrit     Component Value Date/Time   HGB 9.5* 09/23/2012 0600   HCT 29.4* 09/23/2012 0600      Treatments: IV hydration  Discharge Exam: Blood pressure 107/73, pulse 80, temperature 97.7 F (36.5 C), temperature source Oral, resp. rate 16, height 5' 2.99" (1.6 m), weight 149 lb (67.586 kg), last menstrual period 12/11/2011, SpO2 98.00%, unknown if currently breastfeeding. General appearance: alert, cooperative and no distress S: comfortable, little bleeding, slept     breastfeeding O BP 107/73  Pulse 80  Temp 97.7 F (36.5 C) (Oral)  Resp 16  Ht 5' 2.99" (1.6 m)  Wt 149 lb (67.586 kg)  BMI 26.40 kg/m2  SpO2 98%  LMP 12/11/2011  Breastfeeding? Unknown     abd soft, nt, ff      sm  Flow perineum clean intact periuretheral lac, 1 degree perineal lac     -Homans sign bilaterally,       No edema    Component Value Date/Time   HGB 9.5* 09/23/2012 0600   HCT 29.4* 09/23/2012 0600   Disposition: 01-Home or Self Care     Medication List     As of 09/24/2012  8:16 AM    ASK your doctor about these medications         cyclobenzaprine 5 MG tablet   Commonly known as: FLEXERIL   Take 1 tablet (5 mg total) by mouth 3 (three) times daily as needed for muscle spasms.      HYDROmorphone 2 MG tablet   Commonly known as: DILAUDID   Take 1 tablet (2 mg  total) by mouth every 4 (four) hours as needed for pain.      polyethylene glycol packet   Commonly known as: MIRALAX / GLYCOLAX   Take 17 g by mouth daily.      prenatal multivitamin Tabs   Take 1 tablet by mouth daily.           Follow-up Information    Follow up with [facility]. In 5 weeks.   Contact information:   [address] [place] [address] [phone]       iron twice daily, stool softner, miralax, glycerin suppository, enema prn Signed: Triana Coover 09/24/2012, 8:16 AM

## 2012-10-31 ENCOUNTER — Inpatient Hospital Stay (HOSPITAL_COMMUNITY): Payer: Medicaid Other

## 2012-10-31 ENCOUNTER — Encounter (HOSPITAL_COMMUNITY): Payer: Self-pay

## 2012-10-31 ENCOUNTER — Inpatient Hospital Stay (HOSPITAL_COMMUNITY)
Admission: AD | Admit: 2012-10-31 | Discharge: 2012-10-31 | Disposition: A | Discharge status: Home / Self Care | Payer: Medicaid Other | Source: Ambulatory Visit | Attending: Obstetrics and Gynecology | Admitting: Obstetrics and Gynecology

## 2012-10-31 DIAGNOSIS — N289 Disorder of kidney and ureter, unspecified: Secondary | ICD-10-CM | POA: Insufficient documentation

## 2012-10-31 DIAGNOSIS — R109 Unspecified abdominal pain: Secondary | ICD-10-CM | POA: Insufficient documentation

## 2012-10-31 DIAGNOSIS — N1330 Unspecified hydronephrosis: Secondary | ICD-10-CM | POA: Insufficient documentation

## 2012-10-31 DIAGNOSIS — O9089 Other complications of the puerperium, not elsewhere classified: Secondary | ICD-10-CM | POA: Insufficient documentation

## 2012-10-31 DIAGNOSIS — N200 Calculus of kidney: Secondary | ICD-10-CM

## 2012-10-31 LAB — URINALYSIS, ROUTINE W REFLEX MICROSCOPIC
Bilirubin Urine: NEGATIVE
Glucose, UA: NEGATIVE mg/dL
Ketones, ur: 15 mg/dL — AB
pH: 6 (ref 5.0–8.0)

## 2012-10-31 LAB — BASIC METABOLIC PANEL
Chloride: 104 mEq/L (ref 96–112)
Creatinine, Ser: 0.99 mg/dL (ref 0.50–1.10)
GFR calc Af Amer: 88 mL/min — ABNORMAL LOW (ref >90)
Potassium: 3.2 mEq/L — ABNORMAL LOW (ref 3.5–5.1)

## 2012-10-31 LAB — CBC
Platelets: 280 10*3/uL (ref 150–400)
RDW: 16.1 % — ABNORMAL HIGH (ref 11.5–15.5)
WBC: 11.9 10*3/uL — ABNORMAL HIGH (ref 4.0–10.5)

## 2012-10-31 LAB — URINE MICROSCOPIC-ADD ON

## 2012-10-31 MED ORDER — KETOROLAC TROMETHAMINE 30 MG/ML IJ SOLN
30.0000 mg | Freq: Once | INTRAMUSCULAR | Status: AC
  Administered 2012-10-31: 30 mg via INTRAVENOUS
  Filled 2012-10-31: qty 1

## 2012-10-31 MED ORDER — DEXTROSE 5 % IN LACTATED RINGERS IV BOLUS
1000.0000 mL | Freq: Once | INTRAVENOUS | Status: AC
  Administered 2012-10-31: 1000 mL via INTRAVENOUS

## 2012-10-31 MED ORDER — HYDROMORPHONE HCL 2 MG PO TABS: 2.0000 mg | ORAL_TABLET | ORAL | Status: DC | PRN

## 2012-10-31 MED ORDER — CEPHALEXIN 500 MG PO CAPS: 500.0000 mg | ORAL_CAPSULE | Freq: Four times a day (QID) | ORAL | Status: DC

## 2012-10-31 MED ORDER — OXYCODONE-ACETAMINOPHEN 5-325 MG PO TABS
2.0000 | ORAL_TABLET | Freq: Once | ORAL | Status: AC
  Administered 2012-10-31: 2 via ORAL
  Filled 2012-10-31: qty 2

## 2012-10-31 MED ORDER — HYDROMORPHONE HCL PF 1 MG/ML IJ SOLN
1.0000 mg | Freq: Once | INTRAMUSCULAR | Status: AC
  Administered 2012-10-31: 1 mg via INTRAVENOUS
  Filled 2012-10-31: qty 1

## 2012-10-31 MED ORDER — IBUPROFEN 800 MG PO TABS: 800.0000 mg | ORAL_TABLET | Freq: Three times a day (TID) | ORAL | Status: DC | PRN

## 2012-10-31 NOTE — MAU Note (Signed)
Patient states she had a SVD on 10-29. States she started having abdominal pain last night and has continued to have lower abdominal pain that radiates to left flank. Denies any bleeding or unusual discharge.

## 2012-10-31 NOTE — MAU Provider Note (Signed)
History     CSN: [id number]  Arrival date and time: 10/31/12 1333   First Provider Initiated Contact with Patient 10/31/12 1539      Chief Complaint  Patient presents with  . Abdominal Pain   HPI  Pt is s/p NSVD of 11/29 with history of ?kidney stones in pregnancy.  Pt states she had onset of left flank pain and lower abdominal cramping, feeling pressure like she need to have a bowel movement.  Her last bowel movement was 3 days ago without difficulty.  She has taken pain medication from her delivery without relief.  She has not had a fever, or chills. Pt being seen for [facility] since provider in delivery.  Past Medical History  Diagnosis Date  . HPV (human papilloma virus) infection   . Abnormal Pap smear 2000    COLPO X2; LAST PAP 2012  . Infection 2009    UTI   . Kidney stones 2009  . Headache     FREQUENT  . Fracture of right hand 2012  . Anemia 09/23/2012    Past Surgical History  Procedure Date  . Colposcopy   . Appendectomy 2001    DURING PREGNANCY    Family History  Problem Relation Age of Onset  . Anesthesia problems Neg Hx   . Other Neg Hx   . Depression Son 9    AFTER DEATH OF FATHER  . COPD Paternal Grandfather     History  Substance Use Topics  . Smoking status: Never Smoker   . Smokeless tobacco: Never Used  . Alcohol Use: No     Comment: OCCASIONAL MIXED DRINK    Allergies: No Known Allergies  No prescriptions prior to admission    Review of Systems  Constitutional: Negative for fever and chills.  Gastrointestinal: Positive for nausea and abdominal pain. Negative for vomiting, diarrhea and constipation.  Genitourinary: Negative for dysuria and urgency.  Musculoskeletal: Positive for back pain.  Neurological: Negative for headaches.   Physical Exam   Blood pressure 111/75, pulse 77, temperature 97.9 F (36.6 C), temperature source Oral, resp. rate 16, height 5' 3.5" (1.613 m), weight 132 lb (59.875 kg), SpO2 100.00%, unknown if  currently breastfeeding.  Physical Exam  Nursing note and vitals reviewed. Constitutional: She is oriented to person, place, and time. She appears well-developed and well-nourished.       Very uncomfortable appearing; grimacing   HENT:  Head: Normocephalic.  Eyes: Pupils are equal, round, and reactive to light.  Neck: Normal range of motion. Neck supple.  Cardiovascular: Normal rate.   Respiratory: Effort normal.  GI: Soft. Bowel sounds are normal. She exhibits no distension. There is tenderness. There is no rebound and no guarding.       Left CVA tenderness  Musculoskeletal: Normal range of motion.  Neurological: She is alert and oriented to person, place, and time.  Skin: Skin is warm and dry.  Psychiatric: She has a normal mood and affect.    MAU Course  Procedures Results for orders placed during the hospital encounter of 10/31/12 (from the past 24 hour(s))  URINALYSIS, ROUTINE W REFLEX MICROSCOPIC     Status: Abnormal   Collection Time   10/31/12  2:05 PM      Component Value Range   Color, Urine YELLOW  YELLOW   APPearance HAZY (*) CLEAR   Specific Gravity, Urine >1.030 (*) 1.005 - 1.030   pH 6.0  5.0 - 8.0   Glucose, UA NEGATIVE  NEGATIVE mg/dL  Hgb urine dipstick LARGE (*) NEGATIVE   Bilirubin Urine NEGATIVE  NEGATIVE   Ketones, ur 15 (*) NEGATIVE mg/dL   Protein, ur 30 (*) NEGATIVE mg/dL   Urobilinogen, UA 0.2  0.0 - 1.0 mg/dL   Nitrite NEGATIVE  NEGATIVE   Leukocytes, UA NEGATIVE  NEGATIVE  URINE MICROSCOPIC-ADD ON     Status: Abnormal   Collection Time   10/31/12  2:05 PM      Component Value Range   Squamous Epithelial / LPF FEW (*) RARE   WBC, UA 0-2  <3 WBC/hpf   RBC / HPF 7-10  <3 RBC/hpf   Bacteria, UA FEW (*) RARE   Crystals CA OXALATE CRYSTALS (*) NEGATIVE   Urine-Other MUCOUS PRESENT       Assessment and Plan    LINEBERRY,SUSAN 10/31/2012, 3:40 PM   Addendum: 1615 Pt does feel better after pain medicine, awaiting to go to [place]  Addendum:  1900  Pt requesting more pain meds C/o some cramping suprapubically and L flank Mildly tender over suprapubic    RADIOLOGY REPORT*  Clinical Data: Left flank pain. History of kidney stones.  RENAL/URINARY TRACT ULTRASOUND COMPLETE  Comparison: Previous imaging examinations.  Findings:  Right Kidney: Normal, measuring 11.5 cm in length. A tiny  calculus seen on the CT dated 08/02/2009 is not visible.  Left Kidney: Moderately dilated renal collecting system.  Otherwise, normal, measuring 12.4 cm in length.  Bladder: Normal. Only the right ureteral jet was visible.  IMPRESSION:  Moderate left hydronephrosis, most likely due to a non-visualized  obstructing left ureteral calculus.  Original Report Authenticated By: Beckie Salts, M.D.   D/W Dr Su Hilt Orders to d/c pt home with urine strainer if pain can be controlled with PO meds and Urology follow-up Otherwise will admit for pain mgmnt.   Will order PO percocet and reassess Pt did mention that she received PO dilaudid when she was dc'd home w kidney stone before Will consider to change to PO dilaudid, may also need Abx, secondary to ?UTI   Report given to H.Cammie Mcgee [name]   S.Shannara Winbush, [name] 10-31-12 at [phone]

## 2012-11-02 ENCOUNTER — Telehealth: Payer: Self-pay | Admitting: Obstetrics and Gynecology

## 2012-11-02 NOTE — Telephone Encounter (Signed)
Tc TO Richmond Campbell who states phone # is incorrect. Richmond Campbell Will try to contact sister. If reaches pt, to instruct her to call our office for RX.

## 2012-11-02 NOTE — Telephone Encounter (Signed)
Message copied by Mason Jim on Mon Nov 02, 2012  3:23 PM ------      Message from: Malissa Hippo.      Created: Sun Nov 01, 2012  3:13 PM      Regarding: Pt needs referral to urology and Potassium supp        Pt is 6wks Postpartum, came to MAU w flank pain, like a previous kidney stone she had.       Sent home w pain meds       Needs referral to Urology      Needs Rx for K-Dur Daily, disp 30, refills 2             Thanks!       SL

## 2012-11-02 NOTE — Telephone Encounter (Signed)
Tc to pt.  LM to return call. TC to [facility] at [facility] who will contact pt. For appt.

## 2012-11-02 NOTE — Telephone Encounter (Signed)
Message copied by Mason Jim on Mon Nov 02, 2012  8:46 AM ------      Message from: Malissa Hippo.      Created: Sun Nov 01, 2012  3:13 PM      Regarding: Pt needs referral to urology and Potassium supp        Pt is 6wks Postpartum, came to MAU w flank pain, like a previous kidney stone she had.       Sent home w pain meds       Needs referral to Urology      Needs Rx for K-Dur Daily, disp 30, refills 2             Thanks!       SL

## 2012-11-02 NOTE — Telephone Encounter (Signed)
TC to pt. LM to return call.  VM from [place] at [facility] u[facility]. Have been unable to contact pt.

## 2012-11-03 MED ORDER — POTASSIUM CHLORIDE ER 10 MEQ PO TBCR: 20.0000 meq | EXTENDED_RELEASE_TABLET | Freq: Two times a day (BID) | ORAL | Status: DC

## 2012-11-04 ENCOUNTER — Telehealth: Payer: Self-pay | Admitting: Obstetrics and Gynecology

## 2012-11-04 NOTE — Telephone Encounter (Signed)
TC from pt. 11/03/12.  Late entry due to nonfunctioning computers.  Pt states has appt with [facility] 11/04/12.  Per SL, advised Rx for Potassium supp  Has been ordered.  Pt to obtain at pharmacy and take as directed.

## 2012-11-04 NOTE — Telephone Encounter (Signed)
Message copied by Mason Jim on Wed Nov 04, 2012  3:22 PM ------      Message from: Malissa Hippo.      Created: Sun Nov 01, 2012  3:13 PM      Regarding: Pt needs referral to urology and Potassium supp        Pt is 6wks Postpartum, came to MAU w flank pain, like a previous kidney stone she had.       Sent home w pain meds       Needs referral to Urology      Needs Rx for K-Dur Daily, disp 30, refills 2             Thanks!       SL

## 2012-11-05 ENCOUNTER — Other Ambulatory Visit (HOSPITAL_COMMUNITY): Payer: Self-pay | Admitting: Urology

## 2012-11-05 DIAGNOSIS — N200 Calculus of kidney: Secondary | ICD-10-CM

## 2012-11-26 ENCOUNTER — Ambulatory Visit (HOSPITAL_COMMUNITY)
Admission: RE | Admit: 2012-11-26 | Discharge: 2012-11-26 | Payer: Medicaid Other | Source: Ambulatory Visit | Attending: Urology | Admitting: Urology

## 2013-08-17 LAB — OB RESULTS CONSOLE GC/CHLAMYDIA
Chlamydia: NEGATIVE
GC PROBE AMP, GENITAL: NEGATIVE

## 2013-08-17 LAB — OB RESULTS CONSOLE RPR: RPR: NONREACTIVE

## 2013-08-17 LAB — OB RESULTS CONSOLE GBS: STREP GROUP B AG: POSITIVE

## 2013-08-17 LAB — OB RESULTS CONSOLE HIV ANTIBODY (ROUTINE TESTING): HIV: NONREACTIVE

## 2013-11-12 ENCOUNTER — Inpatient Hospital Stay (HOSPITAL_COMMUNITY)
Admission: AD | Admit: 2013-11-12 | Discharge: 2013-11-12 | Disposition: A | Discharge status: Home / Self Care | Payer: Medicaid Other | Source: Ambulatory Visit | Attending: Obstetrics and Gynecology | Admitting: Obstetrics and Gynecology

## 2013-11-12 ENCOUNTER — Encounter (HOSPITAL_COMMUNITY): Payer: Self-pay | Admitting: *Deleted

## 2013-11-12 DIAGNOSIS — O99891 Other specified diseases and conditions complicating pregnancy: Secondary | ICD-10-CM | POA: Insufficient documentation

## 2013-11-12 DIAGNOSIS — N949 Unspecified condition associated with female genital organs and menstrual cycle: Secondary | ICD-10-CM

## 2013-11-12 DIAGNOSIS — R350 Frequency of micturition: Secondary | ICD-10-CM | POA: Insufficient documentation

## 2013-11-12 DIAGNOSIS — R109 Unspecified abdominal pain: Secondary | ICD-10-CM | POA: Insufficient documentation

## 2013-11-12 LAB — URINALYSIS, ROUTINE W REFLEX MICROSCOPIC
Ketones, ur: NEGATIVE mg/dL
Leukocytes, UA: NEGATIVE
Nitrite: NEGATIVE
Urobilinogen, UA: 1 mg/dL (ref 0.0–1.0)
pH: 7 (ref 5.0–8.0)

## 2013-11-12 LAB — URINE MICROSCOPIC-ADD ON

## 2013-11-12 MED ORDER — CYCLOBENZAPRINE HCL 10 MG PO TABS: 10.0000 mg | ORAL_TABLET | Freq: Three times a day (TID) | ORAL | Status: DC | PRN

## 2013-11-12 MED ORDER — CYCLOBENZAPRINE HCL 10 MG PO TABS
10.0000 mg | ORAL_TABLET | Freq: Once | ORAL | Status: AC
  Administered 2013-11-12: 10 mg via ORAL
  Filled 2013-11-12: qty 1

## 2013-11-12 NOTE — MAU Provider Note (Signed)
History   CSN: [id number]  Arrival date and time: 11/12/13 2004   First Provider Initiated Contact with Patient 11/12/13 2041      Chief Complaint  Patient presents with  . Abdominal Cramping   HPI Pt presents unannounced to MAU at 36w 6d gestation with c/o constant lower abdominal pain, cramping and pelvic pressure as well as back pain. Ranks pain as 5-6/10. States she has been having issues with constipation but had BM today and pain has not changed but she still feels constipated.  Has some urinary frequency but no dysuria.  Reports active fetus.  Has occas UC but denies ROM or bldg.   OB History   Grav Para Term Preterm Abortions TAB SAB Ect Mult Living   5 3 3  1  1   3       Past Medical History  Diagnosis Date  . HPV (human papilloma virus) infection   . Abnormal Pap smear 2000    COLPO X2; LAST PAP 2012  . Infection 2009    UTI   . Kidney stones 2009  . Headache(784.0)     FREQUENT  . Fracture of right hand 2012  . Anemia 09/23/2012    Past Surgical History  Procedure Laterality Date  . Colposcopy    . Appendectomy  2001    DURING PREGNANCY    Family History  Problem Relation Age of Onset  . Anesthesia problems Neg Hx   . Other Neg Hx   . Depression Son 9    AFTER DEATH OF FATHER  . COPD Paternal Grandfather     History  Substance Use Topics  . Smoking status: Never Smoker   . Smokeless tobacco: Never Used  . Alcohol Use: No     Comment: OCCASIONAL MIXED DRINK    Allergies: No Known Allergies  Prescriptions prior to admission  Medication Sig Dispense Refill  . acetaminophen (TYLENOL) 500 MG tablet Take 1,000 mg by mouth every 6 (six) hours as needed for mild pain or moderate pain.      Tery Sanfilippo Calcium (STOOL SOFTENER PO) Take 1 tablet by mouth at bedtime.      . pantoprazole (PROTONIX) 40 MG tablet Take 40 mg by mouth daily as needed (indigestion / heartburn).        Review of Systems  Constitutional: Negative.   HENT: Negative.   Eyes:  Negative.   Respiratory: Negative.   Cardiovascular: Negative.   Gastrointestinal: Positive for heartburn and constipation.  Genitourinary: Negative.   Musculoskeletal: Positive for joint pain.  Skin: Negative.   Neurological: Negative.   Endo/Heme/Allergies: Negative.   Psychiatric/Behavioral: Negative.    Physical Exam   Blood pressure 100/75, pulse 93, temperature 98.1 F (36.7 C), resp. rate 20, height 5\' 4"  (1.626 m), weight 69.128 kg (152 lb 6.4 oz).  Physical Exam  Constitutional: She is oriented to person, place, and time. She appears well-developed and well-nourished.  HENT:  Head: Normocephalic and atraumatic.  Right Ear: External ear normal.  Left Ear: External ear normal.  Nose: Nose normal.  Eyes: Conjunctivae are normal. Pupils are equal, round, and reactive to light.  Neck: Normal range of motion. Neck supple.  Cardiovascular: Normal rate, regular rhythm and intact distal pulses.   Respiratory: Effort normal and breath sounds normal.  GI: Soft. Bowel sounds are normal. She exhibits no distension. There is no tenderness. There is no rebound and no guarding.  Gravid  Genitourinary: Uterus normal.  Ut soft, nontender.  Ext gent WNL.  BUS neg.  SVE 2-3/thick/-3/vtx  Musculoskeletal: Normal range of motion.  Neg CVAT  Neurological: She is alert and oriented to person, place, and time. She has normal reflexes.  Skin: Skin is warm and dry.  Psychiatric: She has a normal mood and affect. Her behavior is normal.   FHR baseline 125 bpm; Variability-moderate; Accels-present; Decels-absent.  FHR Cat 1 Toco: Rare irregular UC noted.  MAU Course  Procedures Results for orders placed during the hospital encounter of 11/12/13 (from the past 24 hour(s))  URINALYSIS, ROUTINE W REFLEX MICROSCOPIC     Status: Abnormal   Collection Time    11/12/13  8:20 PM      Result Value Range   Color, Urine YELLOW  YELLOW   APPearance CLEAR  CLEAR   Specific Gravity, Urine 1.020  1.005  - 1.030   pH 7.0  5.0 - 8.0   Glucose, UA NEGATIVE  NEGATIVE mg/dL   Hgb urine dipstick SMALL (*) NEGATIVE   Bilirubin Urine NEGATIVE  NEGATIVE   Ketones, ur NEGATIVE  NEGATIVE mg/dL   Protein, ur NEGATIVE  NEGATIVE mg/dL   Urobilinogen, UA 1.0  0.0 - 1.0 mg/dL   Nitrite NEGATIVE  NEGATIVE   Leukocytes, UA NEGATIVE  NEGATIVE  URINE MICROSCOPIC-ADD ON     Status: None   Collection Time    11/12/13  8:20 PM      Result Value Range   Squamous Epithelial / LPF RARE  RARE   WBC, UA 0-2  <3 WBC/hpf   RBC / HPF 3-6  <3 RBC/hpf   Bacteria, UA RARE  RARE   Urine-Other AMORPHOUS URATES/PHOSPHATES      Assessment and Plan  IUP at 36w 6d Pelvic pain  Flexeril 10mg  po given with decrease in pain prior to discharge to 3-4/10.  Rx 10mg  # 20, no RF, 1 po q8hrs prn given.  Pelvic/Abdominal pain and pelvic pressure d/w pt.  Rec heat, tylenol and mat support belt.   F/U as sched on Monday, 11/15/13. Rev fetal kick counts and s/s labor.    [name] [name] O. 11/12/2013, 8:50 PM

## 2013-11-12 NOTE — MAU Note (Addendum)
I'm having a lot of cramping in lower stomach. Also having pressure in rectum and vaginal area. Discomfort has been since yesterday afternoon. Was 3cm on Weds. Hard to have BM. Went today but sometimes I feel the pressure but can't go

## 2013-11-25 NOTE — L&D Delivery Note (Cosign Needed)
Delivery Note Pt progressed quickly.  SVE at 8:27 PM C/C/+3.  At 8:46 PM a viable female was delivered via Vaginal, Spontaneous Delivery (Presentation: ; Occiput Anterior). No nuchal cord.  No difficulty with shoulders. Infant dried, stimulated and placed on maternal chest with spontaneous cry.  Cord doubly clamped and cut by FOB after cessation of pulsation. APGAR: 8 ,9 ; weight 7# 12 .   Placenta status: Intact, Spontaneous.  Cord: 3 vessels with the following complications: None.  Cord pH: N/A  Anesthesia: Epidural  Episiotomy: None Lacerations: 2nd degree Suture Repair: 3.0 monocryl Est. Blood Loss (mL): 350  Mom to postpartum.  Baby to Couplet care / Skin to Skin.  Nevia Henkin O. 11/27/2013, 9:41 PM

## 2013-11-27 ENCOUNTER — Inpatient Hospital Stay (HOSPITAL_COMMUNITY)
Admission: AD | Admit: 2013-11-27 | Discharge: 2013-11-29 | DRG: 775 | Disposition: A | Discharge status: Home / Self Care | Payer: Medicaid Other | Source: Ambulatory Visit | Attending: Obstetrics and Gynecology | Admitting: Obstetrics and Gynecology

## 2013-11-27 ENCOUNTER — Inpatient Hospital Stay (HOSPITAL_COMMUNITY): Payer: Medicaid Other | Admitting: Anesthesiology

## 2013-11-27 ENCOUNTER — Encounter (HOSPITAL_COMMUNITY): Payer: Self-pay | Admitting: *Deleted

## 2013-11-27 ENCOUNTER — Encounter (HOSPITAL_COMMUNITY): Payer: Medicaid Other | Admitting: Anesthesiology

## 2013-11-27 DIAGNOSIS — O99892 Other specified diseases and conditions complicating childbirth: Principal | ICD-10-CM | POA: Diagnosis present

## 2013-11-27 DIAGNOSIS — IMO0001 Reserved for inherently not codable concepts without codable children: Secondary | ICD-10-CM

## 2013-11-27 DIAGNOSIS — O9989 Other specified diseases and conditions complicating pregnancy, childbirth and the puerperium: Principal | ICD-10-CM

## 2013-11-27 DIAGNOSIS — Z22330 Carrier of Group B streptococcus: Secondary | ICD-10-CM

## 2013-11-27 LAB — CBC
HEMATOCRIT: 35 % — AB (ref 36.0–46.0)
HEMOGLOBIN: 11.4 g/dL — AB (ref 12.0–15.0)
MCH: 26.8 pg (ref 26.0–34.0)
MCHC: 32.6 g/dL (ref 30.0–36.0)
MCV: 82.4 fL (ref 78.0–100.0)
Platelets: 229 10*3/uL (ref 150–400)
RBC: 4.25 MIL/uL (ref 3.87–5.11)
RDW: 15.2 % (ref 11.5–15.5)
WBC: 13 10*3/uL — AB (ref 4.0–10.5)

## 2013-11-27 LAB — TYPE AND SCREEN
ABO/RH(D): O POS
Antibody Screen: NEGATIVE

## 2013-11-27 LAB — RPR: RPR Ser Ql: NONREACTIVE

## 2013-11-27 MED ORDER — SODIUM BICARBONATE 8.4 % IV SOLN
INTRAVENOUS | Status: DC | PRN
  Administered 2013-11-27: 5 mL via EPIDURAL

## 2013-11-27 MED ORDER — ACETAMINOPHEN 325 MG PO TABS: 650.0000 mg | ORAL_TABLET | ORAL | Status: DC | PRN

## 2013-11-27 MED ORDER — IBUPROFEN 600 MG PO TABS
600.0000 mg | ORAL_TABLET | Freq: Four times a day (QID) | ORAL | Status: DC | PRN
  Administered 2013-11-27: 600 mg via ORAL
  Filled 2013-11-27: qty 1

## 2013-11-27 MED ORDER — LIDOCAINE HCL (PF) 1 % IJ SOLN
30.0000 mL | INTRAMUSCULAR | Status: DC | PRN
  Filled 2013-11-27 (×2): qty 30

## 2013-11-27 MED ORDER — PENICILLIN G POTASSIUM 5000000 UNITS IJ SOLR
2.5000 10*6.[IU] | INTRAVENOUS | Status: DC
  Administered 2013-11-27: 2.5 10*6.[IU] via INTRAVENOUS
  Filled 2013-11-27 (×6): qty 2.5

## 2013-11-27 MED ORDER — OXYCODONE-ACETAMINOPHEN 5-325 MG PO TABS
1.0000 | ORAL_TABLET | ORAL | Status: DC | PRN
Start: 2013-11-27 — End: 2013-11-28
  Administered 2013-11-28: 1 via ORAL
  Filled 2013-11-27: qty 1

## 2013-11-27 MED ORDER — OXYTOCIN 40 UNITS IN LACTATED RINGERS INFUSION - SIMPLE MED
1.0000 m[IU]/min | INTRAVENOUS | Status: DC
Start: 2013-11-27 — End: 2013-11-28
  Administered 2013-11-27: 1 m[IU]/min via INTRAVENOUS

## 2013-11-27 MED ORDER — TERBUTALINE SULFATE 1 MG/ML IJ SOLN: 0.2500 mg | Freq: Once | INTRAMUSCULAR | Status: AC | PRN

## 2013-11-27 MED ORDER — DIPHENHYDRAMINE HCL 50 MG/ML IJ SOLN: 12.5000 mg | INTRAMUSCULAR | Status: DC | PRN

## 2013-11-27 MED ORDER — FENTANYL 2.5 MCG/ML BUPIVACAINE 1/10 % EPIDURAL INFUSION (WH - ANES)
14.0000 mL/h | INTRAMUSCULAR | Status: DC | PRN
  Administered 2013-11-27: 14 mL/h via EPIDURAL
  Filled 2013-11-27: qty 125

## 2013-11-27 MED ORDER — FLEET ENEMA 7-19 GM/118ML RE ENEM: 1.0000 | ENEMA | RECTAL | Status: DC | PRN

## 2013-11-27 MED ORDER — OXYTOCIN 40 UNITS IN LACTATED RINGERS INFUSION - SIMPLE MED
62.5000 mL/h | INTRAVENOUS | Status: DC
  Filled 2013-11-27: qty 1000

## 2013-11-27 MED ORDER — FENTANYL CITRATE 0.05 MG/ML IJ SOLN: 100.0000 ug | INTRAMUSCULAR | Status: DC | PRN

## 2013-11-27 MED ORDER — PHENYLEPHRINE 40 MCG/ML (10ML) SYRINGE FOR IV PUSH (FOR BLOOD PRESSURE SUPPORT)
80.0000 ug | PREFILLED_SYRINGE | INTRAVENOUS | Status: DC | PRN
  Filled 2013-11-27: qty 2

## 2013-11-27 MED ORDER — PHENYLEPHRINE 40 MCG/ML (10ML) SYRINGE FOR IV PUSH (FOR BLOOD PRESSURE SUPPORT)
80.0000 ug | PREFILLED_SYRINGE | INTRAVENOUS | Status: DC | PRN
  Filled 2013-11-27: qty 10
  Filled 2013-11-27: qty 2

## 2013-11-27 MED ORDER — OXYTOCIN BOLUS FROM INFUSION: 500.0000 mL | INTRAVENOUS | Status: DC

## 2013-11-27 MED ORDER — EPHEDRINE 5 MG/ML INJ
10.0000 mg | INTRAVENOUS | Status: DC | PRN
  Filled 2013-11-27: qty 2

## 2013-11-27 MED ORDER — EPHEDRINE 5 MG/ML INJ
10.0000 mg | INTRAVENOUS | Status: DC | PRN
  Filled 2013-11-27: qty 2
  Filled 2013-11-27: qty 4

## 2013-11-27 MED ORDER — PENICILLIN G POTASSIUM 5000000 UNITS IJ SOLR
5.0000 10*6.[IU] | Freq: Once | INTRAVENOUS | Status: AC
  Administered 2013-11-27: 5 10*6.[IU] via INTRAVENOUS
  Filled 2013-11-27: qty 5

## 2013-11-27 MED ORDER — LACTATED RINGERS IV SOLN: 500.0000 mL | INTRAVENOUS | Status: DC | PRN

## 2013-11-27 MED ORDER — LACTATED RINGERS IV SOLN
INTRAVENOUS | Status: DC
  Administered 2013-11-27 (×2): via INTRAVENOUS

## 2013-11-27 MED ORDER — CITRIC ACID-SODIUM CITRATE 334-500 MG/5ML PO SOLN: 30.0000 mL | ORAL | Status: DC | PRN

## 2013-11-27 MED ORDER — ONDANSETRON HCL 4 MG/2ML IJ SOLN: 4.0000 mg | Freq: Four times a day (QID) | INTRAMUSCULAR | Status: DC | PRN

## 2013-11-27 MED ORDER — LACTATED RINGERS IV SOLN
500.0000 mL | Freq: Once | INTRAVENOUS | Status: AC
  Administered 2013-11-27: 500 mL via INTRAVENOUS

## 2013-11-27 NOTE — MAU Provider Note (Signed)
History   CSN: [id number]  Arrival date and time: 11/27/13 1314    Chief Complaint  Patient presents with  . Labor Eval   HPI Pt presents unannounced to MAU with c/o of regular uterine contractions since approx midnight 11/27/13 which have increased in intensity and frequency since onset.  Reports active fetus.  Denies ROM or bldg.  OB History   Grav Para Term Preterm Abortions TAB SAB Ect Mult Living   5 3 3  1  1   3       Past Medical History  Diagnosis Date  . HPV (human papilloma virus) infection   . Abnormal Pap smear 2000    COLPO X2; LAST PAP 2012  . Infection 2009    UTI   . Kidney stones 2009  . Headache(784.0)     FREQUENT  . Fracture of right hand 2012  . Anemia 09/23/2012    Past Surgical History  Procedure Laterality Date  . Colposcopy    . Appendectomy  2001    DURING PREGNANCY    Family History  Problem Relation Age of Onset  . Anesthesia problems Neg Hx   . Other Neg Hx   . Depression Son 9    AFTER DEATH OF FATHER  . COPD Paternal Grandfather     History  Substance Use Topics  . Smoking status: Never Smoker   . Smokeless tobacco: Never Used  . Alcohol Use: No     Comment: OCCASIONAL MIXED DRINK    Allergies: No Known Allergies  Prescriptions prior to admission  Medication Sig Dispense Refill  . acetaminophen (TYLENOL) 500 MG tablet Take 1,000 mg by mouth every 6 (six) hours as needed for mild pain or moderate pain.      . cyclobenzaprine (FLEXERIL) 10 MG tablet Take 1 tablet (10 mg total) by mouth 3 (three) times daily as needed for muscle spasms.  20 tablet  0  . Docusate Calcium (STOOL SOFTENER PO) Take 3 tablets by mouth at bedtime.       . pantoprazole (PROTONIX) 40 MG tablet Take 40 mg by mouth daily as needed (indigestion / heartburn).      . Prenatal Vit-Fe Fumarate-FA (PRENATAL MULTIVITAMIN) TABS tablet Take 1 tablet by mouth daily at 12 noon.        Review of Systems  Constitutional: Negative.   HENT: Negative.   Eyes:  Negative.   Respiratory: Negative.   Cardiovascular: Negative.   Gastrointestinal: Negative.   Genitourinary: Negative.   Musculoskeletal: Negative.   Skin: Negative.   Neurological: Negative.   Endo/Heme/Allergies: Negative.   Psychiatric/Behavioral: Negative.    Physical Exam   Blood pressure 101/64, pulse 107, temperature 97.8 F (36.6 C), temperature source Oral, resp. rate 20, height 5\' 3"  (1.6 m), weight 70.364 kg (155 lb 2 oz).  Physical Exam  Constitutional: She is oriented to person, place, and time. She appears well-developed and well-nourished.  HENT:  Head: Normocephalic and atraumatic.  Right Ear: External ear normal.  Left Ear: External ear normal.  Nose: Nose normal.  Eyes: Conjunctivae are normal. Pupils are equal, round, and reactive to light.  Neck: Normal range of motion. Neck supple.  Cardiovascular: Normal rate, regular rhythm and intact distal pulses.   Respiratory: Effort normal and breath sounds normal.  GI: Soft. Bowel sounds are normal.  Gravid  Genitourinary: Vagina normal.  Speculum exam deferred.  SVE 4.5/60%/-2/vtx/soft (previously 3.5/thick/-2 by RN)  Musculoskeletal: Normal range of motion.  Neurological: She is alert and oriented to  person, place, and time. She has normal reflexes.  Skin: Skin is warm and dry.  Psychiatric: She has a normal mood and affect. Her behavior is normal.   FHR baseline 135bpm; Variability-moderate; Accels-present; Decels-non repetitive late decel noted resolved with position change. MAU Course  Procedures  Assessment and Plan  IUP at 39w 0d Active labor Positive GBS  Admit to L&D per consult with Dr. Normand Sloop. Pt desires epidural during labor. PCN G during labor.    Nijae Doyel O. 11/27/2013, 2:38 PM

## 2013-11-27 NOTE — Progress Notes (Signed)
[name] [name] is a 33 y.o. [phone] at 1541w0d by ultrasound admitted for active labor  Subjective: Reports being much more comfortable at present after epidural placement.  Does have some rectal pressure.  Objective: BP 93/68  Pulse 92  Temp(Src) 97.8 F (36.6 C) (Oral)  Resp 16  Ht 5\' 3"  (1.6 m)  Wt 70.364 kg (155 lb 2 oz)  BMI 27.49 kg/m2  SpO2 100%  Breastfeeding? Unknown    FHT:  FHR: 125 bpm, variability: moderate,  accelerations:  Present,  decelerations:  Present Intermittent non-repetitive late decel noted which resolve with position changes. UC:   regular, every 3-7 minutes SVE:   Dilation: 6.0 Effacement (%): 80 Station: -1 Exam by:: [name] [name], [name] AROM performed with sm amt clear fluid returned  Labs: Lab Results  Component Value Date   WBC 13.0* 11/27/2013   HGB 11.4* 11/27/2013   HCT 35.0* 11/27/2013   MCV 82.4 11/27/2013   PLT 229 11/27/2013    Assessment / Plan: Spontaneous labor, progressing normally  Labor: Progressing normally Preeclampsia:  no signs or symptoms of toxicity Fetal Wellbeing:  Category II Pain Control:  Epidural I/D:  GBS positive/Afebrile Anticipated MOD:  NSVD   RBA pitocin augmentation due to decreased frequency of UCs d/w pt and FOB.  They agree to proceed. Will begin Pitocin per protocol.  Continue expectant management at present.  [name] Laramee O. 11/27/2013, 6:25 PM

## 2013-11-27 NOTE — Anesthesia Procedure Notes (Signed)

## 2013-11-27 NOTE — MAU Note (Signed)
Patient presents with complaint of contractions every 5-7 minutes since 0745 today.

## 2013-11-27 NOTE — Anesthesia Preprocedure Evaluation (Signed)

## 2013-11-27 NOTE — H&P (Signed)
[name] [name] is a 33 y.o. female presenting for painful regular contractions at 39w 0d. . History Pt presents with c/o regular painful contractions since approx midnight on 11/27/13.  Active fetus.  Denies ROM or bldg.  Reports UCs have increased in frequency and intensity since onset.    OB History   Grav Para Term Preterm Abortions TAB SAB Ect Mult Living   5 3 3  1  1   3      Hx present preg: Pt with uncertain LMP and presented for prenatal care at approximately [redacted]wks gestation based on LMP.  Ultrasound performed at [redacted]wks gestation by LMP with EGA of [redacted] wks and EDC adjusted to 12/04/13 based on ultrasound.  No anatomic abnormalities identified on ultrasound.  Pt with c/o heartburn at 21wks and txed with Protonix with relief.  Pap at NOB with LGSIL.  Pt signed BTL papers at 21wks.  Positive GBS urine culture at NOB and txed with Pen VK x 7 days.  1hr GTT WNL.  Pt received TDap and influenza vaccine.    Past Medical History  Diagnosis Date  . HPV (human papilloma virus) infection   . Abnormal Pap smear 2000    COLPO X2; LAST PAP 2014-LGSIL  . Infection 2009    UTI   . Kidney stones 2009  . Headache(784.0)     FREQUENT  . Fracture of right hand 2012  . Anemia 09/23/2012   Past Surgical History  Procedure Laterality Date  . Colposcopy    . Appendectomy  2001    DURING PREGNANCY   Family History: family history includes COPD in her paternal grandfather; Depression (age of onset: 629) in her son. There is no history of Anesthesia problems or Other. Social History:  reports that she has never smoked. She has never used smokeless tobacco. She reports that she does not drink alcohol or use illicit drugs.   Prenatal Transfer Tool  Maternal Diabetes: No Genetic Screening: Declined Maternal Ultrasounds/Referrals: Normal Fetal Ultrasounds or other Referrals:  None Maternal Substance Abuse:  No Significant Maternal Medications:  Meds include: Protonix Significant Maternal Lab  Results:  None Other Comments:  None  Review of Systems  Constitutional: Negative.   HENT: Negative.   Eyes: Negative.   Respiratory: Negative.   Cardiovascular: Negative.   Gastrointestinal: Negative.   Genitourinary: Negative.   Musculoskeletal: Negative.   Skin: Negative.   Neurological: Negative.   Endo/Heme/Allergies: Negative.   Psychiatric/Behavioral: Negative.     Dilation: 4.5 Effacement (%): 60 Station: -2 Exam by:: [name], [name] Blood pressure 101/64, pulse 107, temperature 97.8 F (36.6 C), temperature source Oral, resp. rate 20, height 5\' 3"  (1.6 m), weight 70.364 kg (155 lb 2 oz). Maternal Exam:  Uterine Assessment: Contraction strength is moderate.  Contraction frequency is regular.   Abdomen: Patient reports no abdominal tenderness. Fundal height is 39.   Estimated fetal weight is 7#.   Fetal presentation: vertex  Introitus: Normal vulva. Normal vagina.  Ferning test: not done.  Nitrazine test: not done.  Pelvis: adequate for delivery.   Cervix: Cervix evaluated by digital exam.     Fetal Exam Fetal Monitor Review: Mode: ultrasound.   Baseline rate: 135.  Variability: moderate (6-25 bpm).   Pattern: accelerations present and late decelerations.   Non-repetitive late decels noted.  Fetal State Assessment: Category II - tracings are indeterminate.     Physical Exam  Nursing note and vitals reviewed. Constitutional: She is oriented to person, place, and time. She  appears well-developed and well-nourished.  HENT:  Head: Normocephalic and atraumatic.  Right Ear: External ear normal.  Left Ear: External ear normal.  Nose: Nose normal.  Eyes: Conjunctivae are normal. Pupils are equal, round, and reactive to light.  Neck: Normal range of motion. Neck supple.  Cardiovascular: Normal rate, regular rhythm and intact distal pulses.   Respiratory: Effort normal and breath sounds normal.  GI: Soft. Bowel sounds are normal.  Musculoskeletal: Normal  range of motion.  Neurological: She is alert and oriented to person, place, and time. She has normal reflexes.  Skin: Skin is warm and dry.  Psychiatric: She has a normal mood and affect. Her behavior is normal.    Prenatal labs: ABO, Rh: O positive Antibody: Negative Rubella: Nonimmune RPR: Non-reactive  HBsAg: Negative HIV: Non-reactive  GBS: Positive  Assessment/Plan: IUP at 39w 0d Active labor Positive GBS  Admit to [facility] per consult with Dr. Normand Sloop Pt plans epidural anesthesia during labor. Begin PCN G for GBS prophylaxis.  Pt now states she no longer desires BTL.   [name]. 11/27/2013, 2:52 PM

## 2013-11-28 LAB — CBC
HEMATOCRIT: 28.8 % — AB (ref 36.0–46.0)
HEMOGLOBIN: 9.4 g/dL — AB (ref 12.0–15.0)
MCH: 27.2 pg (ref 26.0–34.0)
MCHC: 32.6 g/dL (ref 30.0–36.0)
MCV: 83.5 fL (ref 78.0–100.0)
Platelets: 233 10*3/uL (ref 150–400)
RBC: 3.45 MIL/uL — AB (ref 3.87–5.11)
RDW: 15.6 % — ABNORMAL HIGH (ref 11.5–15.5)
WBC: 10.8 10*3/uL — ABNORMAL HIGH (ref 4.0–10.5)

## 2013-11-28 MED ORDER — DIBUCAINE 1 % RE OINT
1.0000 "application " | TOPICAL_OINTMENT | RECTAL | Status: DC | PRN
  Administered 2013-11-28: 1 via RECTAL
  Filled 2013-11-28: qty 28

## 2013-11-28 MED ORDER — LANOLIN HYDROUS EX OINT: TOPICAL_OINTMENT | CUTANEOUS | Status: DC | PRN

## 2013-11-28 MED ORDER — BENZOCAINE-MENTHOL 20-0.5 % EX AERO
1.0000 "application " | INHALATION_SPRAY | CUTANEOUS | Status: DC | PRN
  Administered 2013-11-28: 1 via TOPICAL
  Filled 2013-11-28: qty 56

## 2013-11-28 MED ORDER — PRENATAL MULTIVITAMIN CH
1.0000 | ORAL_TABLET | Freq: Every day | ORAL | Status: DC
Start: 2013-11-28 — End: 2013-11-29
  Administered 2013-11-28 – 2013-11-29 (×2): 1 via ORAL
  Filled 2013-11-28 (×2): qty 1

## 2013-11-28 MED ORDER — SENNOSIDES-DOCUSATE SODIUM 8.6-50 MG PO TABS
2.0000 | ORAL_TABLET | ORAL | Status: DC
Start: 2013-11-28 — End: 2013-11-29
  Administered 2013-11-29: 2 via ORAL
  Filled 2013-11-28: qty 2

## 2013-11-28 MED ORDER — ONDANSETRON HCL 4 MG PO TABS: 4.0000 mg | ORAL_TABLET | ORAL | Status: DC | PRN

## 2013-11-28 MED ORDER — DIPHENHYDRAMINE HCL 25 MG PO CAPS: 25.0000 mg | ORAL_CAPSULE | Freq: Four times a day (QID) | ORAL | Status: DC | PRN

## 2013-11-28 MED ORDER — MEDROXYPROGESTERONE ACETATE 150 MG/ML IM SUSP
150.0000 mg | Freq: Once | INTRAMUSCULAR | Status: AC
  Administered 2013-11-29: 150 mg via INTRAMUSCULAR
  Filled 2013-11-28: qty 1

## 2013-11-28 MED ORDER — WITCH HAZEL-GLYCERIN EX PADS
1.0000 "application " | MEDICATED_PAD | CUTANEOUS | Status: DC | PRN
  Administered 2013-11-28: 1 via TOPICAL

## 2013-11-28 MED ORDER — OXYCODONE-ACETAMINOPHEN 5-325 MG PO TABS
1.0000 | ORAL_TABLET | ORAL | Status: DC | PRN
Start: 2013-11-28 — End: 2013-11-29
  Administered 2013-11-28 (×2): 2 via ORAL
  Administered 2013-11-28 (×2): 1 via ORAL
  Filled 2013-11-28: qty 1
  Filled 2013-11-28 (×2): qty 2
  Filled 2013-11-28: qty 1

## 2013-11-28 MED ORDER — ONDANSETRON HCL 4 MG/2ML IJ SOLN: 4.0000 mg | INTRAMUSCULAR | Status: DC | PRN

## 2013-11-28 MED ORDER — SIMETHICONE 80 MG PO CHEW: 80.0000 mg | CHEWABLE_TABLET | ORAL | Status: DC | PRN

## 2013-11-28 MED ORDER — ZOLPIDEM TARTRATE 5 MG PO TABS: 5.0000 mg | ORAL_TABLET | Freq: Every evening | ORAL | Status: DC | PRN

## 2013-11-28 MED ORDER — TETANUS-DIPHTH-ACELL PERTUSSIS 5-2.5-18.5 LF-MCG/0.5 IM SUSP: 0.5000 mL | Freq: Once | INTRAMUSCULAR | Status: DC

## 2013-11-28 MED ORDER — IBUPROFEN 600 MG PO TABS
600.0000 mg | ORAL_TABLET | Freq: Four times a day (QID) | ORAL | Status: DC
  Administered 2013-11-28 – 2013-11-29 (×6): 600 mg via ORAL
  Filled 2013-11-28 (×6): qty 1

## 2013-11-28 NOTE — Progress Notes (Addendum)
Post Partum Day 1:S/P SVB, 2nd degree Subjective: Patient up ad lib, denies syncope or dizziness. Feeding:  Breast Contraceptive plan:   Depo on d/c, then Nexplanon  Objective: Blood pressure 92/58, pulse 87, temperature 98.6 F (37 C), temperature source Oral, resp. rate 20, height 5\' 3"  (1.6 m), weight 155 lb 2 oz (70.364 kg), SpO2 100.00%, unknown if currently breastfeeding.  Physical Exam:  General: alert Lochia: appropriate Uterine Fundus: firm Incision: healing well DVT Evaluation: No evidence of DVT seen on physical exam. Negative Homan's sign.   Recent Labs  11/27/13 1505 11/28/13 0614  HGB 11.4* 9.4*  HCT 35.0* 28.8*  Orthostatics stable.  Assessment/Plan: S/P Vaginal delivery day 1 Continue current care Plan for discharge tomorrow Depo Provera prior to d/c.   LOS: 1 day   Jakub Debold 11/28/2013, 11:18 AM

## 2013-11-28 NOTE — Progress Notes (Signed)
Clinical Social Work Department PSYCHOSOCIAL ASSESSMENT - MATERNAL/CHILD 11/28/2013  Patient:  [name] [name]  Account Number:  [id number]  Admit Date:  11/27/2013  Childs Name:   [name] [name]    Clinical Social Worker:  Jaquarious Grey, LCSW   Date/Time:  11/28/2013 02:30 PM  Date Referred:  11/27/2012   Referral source  Central Nurs[facility]     Referred reason  Depression/Anxiety   Other referral source:    [name]:  FAMILY / HOME ENVIRONMENT Child's legal guardian:  PARENT  Guardian - Name Guardian - Age Guardian - Address  [name] [name] [address]  [place], [place] [address]  [name], [name]  same as above   Other household support members/support persons Other support:    II  PSYCHOSOCIAL DATA Information Source:    Financial and Community Resources Employment:   Spouse employed   Financial resources:  Medicaid If Medicaid - County:   Other  WIC   School / Grade:   Maternity Care Coordinator / Child Services Coordination / Early Interventions:  Cultural issues impacting care:    III  STRENGTHS  Strength comment:    IV  RISK FACTORS AND CURRENT PROBLEMS Current Problem:       V  SOCIAL WORK ASSESSMENT Acknowledged order for Social Work consult to assess mother's history of depression. Parents are married and have 3 other dependent ages 13,9 and 1.  Mother denies any hx of depression or anxiety.   She denies any current symptoms of depression or anxiety and reports no hx of psychiatric hospitalization.   She also denies any hx of substance abuse.  No acute social concerns reported or noted at this time.  Mother informed of social work availability.      VI SOCIAL WORK PLAN Social Work Plan  No Further Intervention Required / No Barriers to Discharge   Type of pt/family education:   If child protective services report - county:   If child protective services report - date:   Information/referral to community resources comment:   Other social work  plan:     

## 2013-11-29 MED ORDER — MEDROXYPROGESTERONE ACETATE 150 MG/ML IM SUSP: 150.0000 mg | Freq: Once | INTRAMUSCULAR | Status: AC

## 2013-11-29 NOTE — Progress Notes (Signed)
UR chart review completed.  

## 2013-11-29 NOTE — Discharge Instructions (Signed)
Vaginal Delivery °Care After °Refer to this sheet in the next few weeks. These discharge instructions provide you with information on caring for yourself after delivery. Your caregiver may also give you specific instructions. Your treatment has been planned according to the most current medical practices available, but problems sometimes occur. Call your caregiver if you have any problems or questions after you go home. °HOME CARE INSTRUCTIONS °· Take over-the-counter or prescription medicines only as directed by your caregiver or pharmacist. °· Do not drink alcohol, especially if you are breastfeeding or taking medicine to relieve pain. °· Do not chew or smoke tobacco. °· Do not use illegal drugs. °· Continue to use good perineal care. Good perineal care includes: °· Wiping your perineum from front to back. °· Keeping your perineum clean. °· Do not use tampons or douche until your caregiver says it is okay. °· Shower, wash your hair, and take tub baths as directed by your caregiver. °· Wear a well-fitting bra that provides breast support. °· Eat healthy foods. °· Drink enough fluids to keep your urine clear or pale yellow. °· Eat high-fiber foods such as whole grain cereals and breads, brown rice, beans, and fresh fruits and vegetables every day. These foods may help prevent or relieve constipation. °· Follow your cargiver's recommendations regarding resumption of activities such as climbing stairs, driving, lifting, exercising, or traveling. °· Talk to your caregiver about resuming sexual activities. Resumption of sexual activities is dependent upon your risk of infection, your rate of healing, and your comfort and desire to resume sexual activity. °· Try to have someone help you with your household activities and your newborn for at least a few days after you leave the hospital. °· Rest as much as possible. Try to rest or take a nap when your newborn is sleeping. °· Increase your activities gradually. °· Keep all  of your scheduled postpartum appointments. It is very important to keep your scheduled follow-up appointments. At these appointments, your caregiver will be checking to make sure that you are healing physically and emotionally. °SEEK MEDICAL CARE IF:  °· You are passing large clots from your vagina. Save any clots to show your caregiver. °· You have a foul smelling discharge from your vagina. °· You have trouble urinating. °· You are urinating frequently. °· You have pain when you urinate. °· You have a change in your bowel movements. °· You have increasing redness, pain, or swelling near your vaginal incision (episiotomy) or vaginal tear. °· You have pus draining from your episiotomy or vaginal tear. °· Your episiotomy or vaginal tear is separating. °· You have painful, hard, or reddened breasts. °· You have a severe headache. °· You have blurred vision or see spots. °· You feel sad or depressed. °· You have thoughts of hurting yourself or your newborn. °· You have questions about your care, the care of your newborn, or medicines. °· You are dizzy or lightheaded. °· You have a rash. °· You have nausea or vomiting. °· You were breastfeeding and have not had a menstrual period within 12 weeks after you stopped breastfeeding. °· You are not breastfeeding and have not had a menstrual period by the 12th week after delivery. °· You have a fever. °SEEK IMMEDIATE MEDICAL CARE IF:  °· You have persistent pain. °· You have chest pain. °· You have shortness of breath. °· You faint. °· You have leg pain. °· You have stomach pain. °· Your vaginal bleeding saturates two or more sanitary pads   in 1 hour. °MAKE SURE YOU:  °· Understand these instructions. °· Will watch your condition. °· Will get help right away if you are not doing well or get worse. °Document Released: 11/08/2000 Document Revised: 08/05/2012 Document Reviewed: 07/08/2012 °[employer]® Patient Information ©2014 [employer]. ° °Breastfeeding °Deciding to breastfeed  is one of the best choices you can make for you and your baby. A change in hormones during pregnancy causes your breast tissue to grow and increases the number and size of your milk ducts. These hormones also allow proteins, sugars, and fats from your blood supply to make breast milk in your milk-producing glands. Hormones prevent breast milk from being released before your baby is born as well as prompt milk flow after birth. Once breastfeeding has begun, thoughts of your baby, as well as his or her sucking or crying, can stimulate the release of milk from your milk-producing glands.  °BENEFITS OF BREASTFEEDING °For Your Baby °· Your first milk (colostrum) helps your baby's digestive system function better.   °· There are antibodies in your milk that help your baby fight off infections.   °· Your baby has a lower incidence of asthma, allergies, and sudden infant death syndrome.   °· The nutrients in breast milk are better for your baby than infant formulas and are designed uniquely for your baby's needs.   °· Breast milk improves your baby's brain development.   °· Your baby is less likely to develop other conditions, such as childhood obesity, asthma, or type 2 diabetes mellitus.   °For You  °· Breastfeeding helps to create a very special bond between you and your baby.   °· Breastfeeding is convenient. Breast milk is always available at the correct temperature and costs nothing.   °· Breastfeeding helps to burn calories and helps you lose the weight gained during pregnancy.   °· Breastfeeding makes your uterus contract to its prepregnancy size faster and slows bleeding (lochia) after you give birth.   °· Breastfeeding helps to lower your risk of developing type 2 diabetes mellitus, osteoporosis, and breast or ovarian cancer later in life. °SIGNS THAT YOUR BABY IS HUNGRY °Early Signs of Hunger  °· Increased alertness or activity. °· Stretching. °· Movement of the head from side to side. °· Movement of the head and  opening of the mouth when the corner of the mouth or cheek is stroked (rooting). °· Increased sucking sounds, smacking lips, cooing, sighing, or squeaking. °· Hand-to-mouth movements. °· Increased sucking of fingers or hands. °Late Signs of Hunger °· Fussing. °· Intermittent crying. °Extreme Signs of Hunger °Signs of extreme hunger will require calming and consoling before your baby will be able to breastfeed successfully. Do not wait for the following signs of extreme hunger to occur before you initiate breastfeeding:   °· Restlessness. °· A loud, strong cry. °·  Screaming. °BREASTFEEDING BASICS °Breastfeeding Initiation °· Find a comfortable place to sit or lie down, with your neck and back well supported. °· Place a pillow or rolled up blanket under your baby to bring him or her to the level of your breast (if you are seated). Nursing pillows are specially designed to help support your arms and your baby while you breastfeed. °· Make sure that your baby's abdomen is facing your abdomen.   °· Gently massage your breast. With your fingertips, massage from your chest wall toward your nipple in a circular motion. This encourages milk flow. You may need to continue this action during the feeding if your milk flows slowly. °· Support your breast with 4 fingers underneath   and your thumb above your nipple. Make sure your fingers are well away from your nipple and your baby's mouth.   °· Stroke your baby's lips gently with your finger or nipple.   °· When your baby's mouth is open wide enough, quickly bring your baby to your breast, placing your entire nipple and as much of the colored area around your nipple (areola) as possible into your baby's mouth.   °· More areola should be visible above your baby's upper lip than below the lower lip.   °· Your baby's tongue should be between his or her lower gum and your breast.   °· Ensure that your baby's mouth is correctly positioned around your nipple (latched). Your baby's  lips should create a seal on your breast and be turned out (everted). °· It is common for your baby to suck about 2 3 minutes in order to start the flow of breast milk. °Latching °Teaching your baby how to latch on to your breast properly is very important. An improper latch can cause nipple pain and decreased milk supply for you and poor weight gain in your baby. Also, if your baby is not latched onto your nipple properly, he or she may swallow some air during feeding. This can make your baby fussy. Burping your baby when you switch breasts during the feeding can help to get rid of the air. However, teaching your baby to latch on properly is still the best way to prevent fussiness from swallowing air while breastfeeding. °Signs that your baby has successfully latched on to your nipple:    °· Silent tugging or silent sucking, without causing you pain.   °· Swallowing heard between every 3 4 sucks.   °·  Muscle movement above and in front of his or her ears while sucking.   °Signs that your baby has not successfully latched on to nipple:  °· Sucking sounds or smacking sounds from your baby while breastfeeding. °· Nipple pain. °If you think your baby has not latched on correctly, slip your finger into the corner of your baby's mouth to break the suction and place it between your baby's gums. Attempt breastfeeding initiation again. °Signs of Successful Breastfeeding °Signs from your baby:   °· A gradual decrease in the number of sucks or complete cessation of sucking.   °· Falling asleep.   °· Relaxation of his or her body.   °· Retention of a small amount of milk in his or her mouth.   °· Letting go of your breast by himself or herself. °Signs from you: °· Breasts that have increased in firmness, weight, and size 1 3 hours after feeding.   °· Breasts that are softer immediately after breastfeeding. °· Increased milk volume, as well as a change in milk consistency and color by the 5th day of breastfeeding.   °· Nipples  that are not sore, cracked, or bleeding. °Signs That Your Baby is Getting Enough Milk °· Wetting at least 3 diapers in a 24-hour period. The urine should be clear and pale yellow by age 5 days. °· At least 3 stools in a 24-hour period by age 5 days. The stool should be soft and yellow. °· At least 3 stools in a 24-hour period by age 7 days. The stool should be seedy and yellow. °· No loss of weight greater than 10% of birth weight during the first 3 days of age. °· Average weight gain of 4 7 ounces (120 210 mL) per week after age 4 days. °· Consistent daily weight gain by age 5 days, without weight loss   after the age of 2 weeks. °After a feeding, your baby may spit up a small amount. This is common. °BREASTFEEDING FREQUENCY AND DURATION °Frequent feeding will help you make more milk and can prevent sore nipples and breast engorgement. Breastfeed when you feel the need to reduce the fullness of your breasts or when your baby shows signs of hunger. This is called "breastfeeding on demand." Avoid introducing a pacifier to your baby while you are working to establish breastfeeding (the first 4 6 weeks after your baby is born). After this time you may choose to use a pacifier. Research has shown that pacifier use during the first year of a baby's life decreases the risk of sudden infant death syndrome (SIDS). °Allow your baby to feed on each breast as long as he or she wants. Breastfeed until your baby is finished feeding. When your baby unlatches or falls asleep while feeding from the first breast, offer the second breast. Because newborns are often sleepy in the first few weeks of life, you may need to awaken your baby to get him or her to feed. °Breastfeeding times will vary from baby to baby. However, the following rules can serve as a guide to help you ensure that your baby is properly fed: °· Newborns (babies 4 weeks of age or younger) may breastfeed every 1 3 hours. °· Newborns should not go longer than 3 hours  during the day or 5 hours during the night without breastfeeding. °· You should breastfeed your baby a minimum of 8 times in a 24-hour period until you begin to introduce solid foods to your baby at around 6 months of age. °BREAST MILK PUMPING °Pumping and storing breast milk allows you to ensure that your baby is exclusively fed your breast milk, even at times when you are unable to breastfeed. This is especially important if you are going back to work while you are still breastfeeding or when you are not able to be present during feedings. Your lactation consultant can give you guidelines on how long it is safe to store breast milk.  °A breast pump is a machine that allows you to pump milk from your breast into a sterile bottle. The pumped breast milk can then be stored in a refrigerator or freezer. Some breast pumps are operated by hand, while others use electricity. Ask your lactation consultant which type will work best for you. Breast pumps can be purchased, but some hospitals and breastfeeding support groups lease breast pumps on a monthly basis. A lactation consultant can teach you how to hand express breast milk, if you prefer not to use a pump.  °CARING FOR YOUR BREASTS WHILE YOU BREASTFEED °Nipples can become dry, cracked, and sore while breastfeeding. The following recommendations can help keep your breasts moisturized and healthy: °· Avoid using soap on your nipples.   °· Wear a supportive bra. Although not required, special nursing bras and tank tops are designed to allow access to your breasts for breastfeeding without taking off your entire bra or top. Avoid wearing underwire style bras or extremely tight bras. °· Air dry your nipples for 3 4 minutes after each feeding.   °· Use only cotton bra pads to absorb leaked breast milk. Leaking of breast milk between feedings is normal.   °· Use lanolin on your nipples after breastfeeding. Lanolin helps to maintain your skin's normal moisture barrier. If you  use pure lanolin you do not need to wash it off before feeding your baby again. Pure lanolin is not   toxic to your baby. You may also hand express a few drops of breast milk and gently massage that milk into your nipples and allow the milk to air dry. °In the first few weeks after giving birth, some women experience extremely full breasts (engorgement). Engorgement can make your breasts feel heavy, warm, and tender to the touch. Engorgement peaks within 3 5 days after you give birth. The following recommendations can help ease engorgement: °· Completely empty your breasts while breastfeeding or pumping. You may want to start by applying warm, moist heat (in the shower or with warm water-soaked hand towels) just before feeding or pumping. This increases circulation and helps the milk flow. If your baby does not completely empty your breasts while breastfeeding, pump any extra milk after he or she is finished. °· Wear a snug bra (nursing or regular) or tank top for 1 2 days to signal your body to slightly decrease milk production. °· Apply ice packs to your breasts, unless this is too uncomfortable for you. °· Make sure that your baby is latched on and positioned properly while breastfeeding. °If engorgement persists after 48 hours of following these recommendations, contact your health care provider or a lactation consultant. °OVERALL HEALTH CARE RECOMMENDATIONS WHILE BREASTFEEDING °· Eat healthy foods. Alternate between meals and snacks, eating 3 of each per day. Because what you eat affects your breast milk, some of the foods may make your baby more irritable than usual. Avoid eating these foods if you are sure that they are negatively affecting your baby. °· Drink milk, fruit juice, and water to satisfy your thirst (about 10 glasses a day).   °· Rest often, relax, and continue to take your prenatal vitamins to prevent fatigue, stress, and anemia. °· Continue breast self-awareness checks. °· Avoid chewing and  smoking tobacco. °· Avoid alcohol and drug use. °Some medicines that may be harmful to your baby can pass through breast milk. It is important to ask your health care provider before taking any medicine, including all over-the-counter and prescription medicine as well as vitamin and herbal supplements. °It is possible to become pregnant while breastfeeding. If birth control is desired, ask your health care provider about options that will be safe for your baby. °SEEK MEDICAL CARE IF:  °· You feel like you want to stop breastfeeding or have become frustrated with breastfeeding. °· You have painful breasts or nipples. °· Your nipples are cracked or bleeding. °· Your breasts are red, tender, or warm. °· You have a swollen area on either breast. °· You have a fever or chills. °· You have nausea or vomiting. °· You have drainage other than breast milk from your nipples. °· Your breasts do not become full before feedings by the 5th day after you give birth. °· You feel sad and depressed. °· Your baby is too sleepy to eat well. °· Your baby is having trouble sleeping.   °· Your baby is wetting less than 3 diapers in a 24-hour period. °· Your baby has less than 3 stools in a 24-hour period. °· Your baby's skin or the white part of his or her eyes becomes yellow.   °· Your baby is not gaining weight by 5 days of age. °SEEK IMMEDIATE MEDICAL CARE IF:  °· Your baby is overly tired (lethargic) and does not want to wake up and feed. °· Your baby develops an unexplained fever. °Document Released: 11/11/2005 Document Revised: 07/14/2013 Document Reviewed: 05/05/2013 °[employer]® Patient Information ©2014 [employer]. ° °Postpartum Depression and Baby   Blues °The postpartum period begins right after the birth of a baby. During this time, there is often a great amount of joy and excitement. It is also a time of considerable changes in the life of the parent(s). Regardless of how many times a mother gives birth, each child brings new  challenges and dynamics to the family. It is not unusual to have feelings of excitement accompanied by confusing shifts in moods, emotions, and thoughts. All mothers are at risk of developing postpartum depression or the "baby blues." These mood changes can occur right after giving birth, or they may occur many months after giving birth. The baby blues or postpartum depression can be mild or severe. Additionally, postpartum depression can resolve rather quickly, or it can be a long-term condition. °CAUSES °Elevated hormones and their rapid decline are thought to be a main cause of postpartum depression and the baby blues. There are a number of hormones that radically change during and after pregnancy. Estrogen and progesterone usually decrease immediately after delivering your baby. The level of thyroid hormone and various cortisol steroids also rapidly drop. Other factors that play a major role in these changes include major life events and genetics.  °RISK FACTORS °If you have any of the following risks for the baby blues or postpartum depression, know what symptoms to watch out for during the postpartum period. Risk factors that may increase the likelihood of getting the baby blues or postpartum depression include: °· Having a personal or family history of depression. °· Having depression while being pregnant. °· Having premenstrual or oral contraceptive-associated mood issues. °· Having exceptional life stress. °· Having marital conflict. °· Lacking a social support network. °· Having a baby with special needs. °· Having health problems such as diabetes. °SYMPTOMS °Baby blues symptoms include: °· Brief fluctuations in mood, such as going from extreme happiness to sadness. °· Decreased concentration. °· Difficulty sleeping. °· Crying spells, tearfulness. °· Irritability. °· Anxiety. °Postpartum depression symptoms typically begin within the first month after giving birth. These symptoms include: °· Difficulty  sleeping or excessive sleepiness. °· Marked weight loss. °· Agitation. °· Feelings of worthlessness. °· Lack of interest in activity or food. °Postpartum psychosis is a very concerning condition and can be dangerous. Fortunately, it is rare. Displaying any of the following symptoms is cause for immediate medical attention. Postpartum psychosis symptoms include: °· Hallucinations and delusions. °· Bizarre or disorganized behavior. °· Confusion or disorientation. °DIAGNOSIS  °A diagnosis is made by an evaluation of your symptoms. There are no medical or lab tests that lead to a diagnosis, but there are various questionnaires that a caregiver may use to identify those with the baby blues, postpartum depression, or psychosis. Often times, a screening tool called the Edinburgh Postnatal Depression Scale is used to diagnose depression in the postpartum period.  °TREATMENT °The baby blues usually goes away on its own in 1 to 2 weeks. Social support is often all that is needed. You should be encouraged to get adequate sleep and rest. Occasionally, you may be given medicines to help you sleep.  °Postpartum depression requires treatment as it can last several months or longer if it is not treated. Treatment may include individual or group therapy, medicine, or both to address any social, physiological, and psychological factors that may play a role in the depression. Regular exercise, a healthy diet, rest, and social support may also be strongly recommended.  °Postpartum psychosis is more serious and needs treatment right away. Hospitalization is often needed. °HOME   CARE INSTRUCTIONS °· Get as much rest as you can. Nap when the baby sleeps. °· Exercise regularly. Some women find yoga and walking to be beneficial. °· Eat a balanced and nourishing diet. °· Do little things that you enjoy. Have a cup of tea, take a bubble bath, read your favorite magazine, or listen to your favorite music. °· Avoid alcohol. °· Ask for help with  household chores, cooking, grocery shopping, or running errands as needed. Do not try to do everything. °· Talk to people close to you about how you are feeling. Get support from your partner, family members, friends, or other new moms. °· Try to stay positive in how you think. Think about the things you are grateful for. °· Do not spend a lot of time alone. °· Only take medicine as directed by your caregiver. °· Keep all your postpartum appointments. °· Let your caregiver know if you have any concerns. °SEEK MEDICAL CARE IF: °You are having a reaction or problems with your medicine. °SEEK IMMEDIATE MEDICAL CARE IF: °· You have suicidal feelings. °· You feel you may harm the baby or someone else. °Document Released: 08/15/2004 Document Revised: 02/03/2012 Document Reviewed: 09/17/2011 °[employer]® Patient Information ©2014 [employer]. ° ° °

## 2013-11-29 NOTE — Discharge Summary (Signed)
Vaginal Delivery Discharge Summary  [name] [name]  DOB:    09-Oct-1981 MRN:    [id number] CSN:    [id number]  Date of admission:                  11/27/2012  Date of discharge:                   11/29/2512  Procedures this admission: NSVD, epidural   Date of Delivery: 11/27/2012  Newborn Data:  Live born female  Birth Weight: 7 lb 12 oz (3515 g) APGAR: 8, 9  Home with mother.  Circumcision Plan: declines   History of Present Illness:  [name] [name] is a 33 y.o. female, [phone], who presents at [redacted]w[redacted]d weeks gestation. The patient has been followed at the [facility] division of [facility]. She was admitted onset of labor. Her pregnancy has been complicated by: none.  Hospital course:  The patient was admitted for .   Her labor was not complicated. She proceeded to have a vaginal delivery of a healthy infant. Her delivery was not complicated. Her postpartum course was not complicated.  She was discharged to home on postpartum day 2 doing well.  Feeding:  breast  Contraception:  Depo-Provera  Discharge hemoglobin:  Hemoglobin  Date Value Range Status  11/28/2013 9.4* 12.0 - 15.0 g/dL Final     REPEATED TO VERIFY     DELTA CHECK NOTED     HCT  Date Value Range Status  11/28/2013 28.8* 36.0 - 46.0 % Final    Discharge Physical Exam:   General: alert and no distress Lochia: appropriate Uterine Fundus: firm Incision: healing well DVT Evaluation: No evidence of DVT seen on physical exam. Negative Homan's sign.  Intrapartum Procedures: spontaneous vaginal delivery Postpartum Procedures: none Complications-Operative and Postpartum: none  Discharge Diagnoses: Term Pregnancy-delivered  Discharge Information:  Activity:           pelvic rest Diet:                routine Medications: None Condition:      stable Instructions:   Postpartum Care After Vaginal Delivery  After you deliver  your newborn (postpartum period), the usual stay in the hospital is 24 72 hours. If there were problems with your labor or delivery, or if you have other medical problems, you might be in the hospital longer.  While you are in the hospital, you will receive help and instructions on how to care for yourself and your newborn during the postpartum period.  While you are in the hospital:  Be sure to tell your nurses if you have pain or discomfort, as well as where you feel the pain and what makes the pain worse.  If you had an incision made near your vagina (episiotomy) or if you had some tearing during delivery, the nurses may put ice packs on your episiotomy or tear. The ice packs may help to reduce the pain and swelling.  If you are breastfeeding, you may feel uncomfortable contractions of your uterus for a couple of weeks. This is normal. The contractions help your uterus get back to normal size.  It is normal to have some bleeding after delivery.  For the first 1 3 days after delivery, the flow is red and the amount may be similar to a period.  It is common for the flow to start and stop.  In the first few days, you  may pass some small clots. Let your nurses know if you begin to pass large clots or your flow increases.  Do not  flush blood clots down the toilet before having the nurse look at them.  During the next 3 10 days after delivery, your flow should become more watery and pink or brown-tinged in color.  Ten to fourteen days after delivery, your flow should be a small amount of yellowish-white discharge.  The amount of your flow will decrease over the first few weeks after delivery. Your flow may stop in 6 8 weeks. Most women have had their flow stop by 12 weeks after delivery.  You should change your sanitary pads frequently.  Wash your hands thoroughly with soap and water for at least 20 seconds after changing pads, using the toilet, or before holding or feeding your  newborn.  You should feel like you need to empty your bladder within the first 6 8 hours after delivery.  In case you become weak, lightheaded, or faint, call your nurse before you get out of bed for the first time and before you take a shower for the first time.  Within the first few days after delivery, your breasts may begin to feel tender and full. This is called engorgement. Breast tenderness usually goes away within 48 72 hours after engorgement occurs. You may also notice milk leaking from your breasts. If you are not breastfeeding, do not stimulate your breasts. Breast stimulation can make your breasts produce more milk.  Spending as much time as possible with your newborn is very important. During this time, you and your newborn can feel close and get to know each other. Having your newborn stay in your room (rooming in) will help to strengthen the bond with your newborn. It will give you time to get to know your newborn and become comfortable caring for your newborn.  Your hormones change after delivery. Sometimes the hormone changes can temporarily cause you to feel sad or tearful. These feelings should not last more than a few days. If these feelings last longer than that, you should talk to your caregiver.  If desired, talk to your caregiver about methods of family planning or contraception.  Talk to your caregiver about immunizations. Your caregiver may want you to have the following immunizations before leaving the hospital:  Tetanus, diphtheria, and pertussis (Tdap) or tetanus and diphtheria (Td) immunization. It is very important that you and your family (including grandparents) or others caring for your newborn are up-to-date with the Tdap or Td immunizations. The Tdap or Td immunization can help protect your newborn from getting ill.  Rubella immunization.  Varicella (chickenpox) immunization.  Influenza immunization. You should receive this annual immunization if you did  not receive the immunization during your pregnancy. Document Released: 09/08/2007 Document Revised: 08/05/2012 Document Reviewed: 07/08/2012 [facility] Patient Information 2014 [place], [place].   Postpartum Depression and Baby Blues  The postpartum period begins right after the birth of a baby. During this time, there is often a great amount of joy and excitement. It is also a time of considerable changes in the life of the parent(s). Regardless of how many times a mother gives birth, each child brings new challenges and dynamics to the family. It is not unusual to have feelings of excitement accompanied by confusing shifts in moods, emotions, and thoughts. All mothers are at risk of developing postpartum depression or the "baby blues." These mood changes can occur right after giving birth, or they 22  occur many months after giving birth. The baby blues or postpartum depression can be mild or severe. Additionally, postpartum depression can resolve rather quickly, or it can be a long-term condition. CAUSES Elevated hormones and their rapid decline are thought to be a main cause of postpartum depression and the baby blues. There are a number of hormones that radically change during and after pregnancy. Estrogen and progesterone usually decrease immediately after delivering your baby. The level of thyroid hormone and various cortisol steroids also rapidly drop. Other factors that play a major role in these changes include major life events and genetics.  RISK FACTORS If you have any of the following risks for the baby blues or postpartum depression, know what symptoms to watch out for during the postpartum period. Risk factors that may increase the likelihood of getting the baby blues or postpartum depression include:  Havinga personal or family history of depression.  Having depression while being pregnant.  Having premenstrual or oral contraceptive-associated mood issues.  Having exceptional life  stress.  Having marital conflict.  Lacking a social support network.  Having a baby with special needs.  Having health problems such as diabetes. SYMPTOMS Baby blues symptoms include:  Brief fluctuations in mood, such as going from extreme happiness to sadness.  Decreased concentration.  Difficulty sleeping.  Crying spells, tearfulness.  Irritability.  Anxiety. Postpartum depression symptoms typically begin within the first month after giving birth. These symptoms include:  Difficulty sleeping or excessive sleepiness.  Marked weight loss.  Agitation.  Feelings of worthlessness.  Lack of interest in activity or food. Postpartum psychosis is a very concerning condition and can be dangerous. Fortunately, it is rare. Displaying any of the following symptoms is cause for immediate medical attention. Postpartum psychosis symptoms include:  Hallucinations and delusions.  Bizarre or disorganized behavior.  Confusion or disorientation. DIAGNOSIS  A diagnosis is made by an evaluation of your symptoms. There are no medical or lab tests that lead to a diagnosis, but there are various questionnaires that a caregiver may use to identify those with the baby blues, postpartum depression, or psychosis. Often times, a screening tool called the [place] Postnatal Depression Scale is used to diagnose depression in the postpartum period.  TREATMENT The baby blues usually goes away on its own in 1 to 2 weeks. Social support is often all that is needed. You should be encouraged to get adequate sleep and rest. Occasionally, you may be given medicines to help you sleep.  Postpartum depression requires treatment as it can last several months or longer if it is not treated. Treatment may include individual or group therapy, medicine, or both to address any social, physiological, and psychological factors that may play a role in the depression. Regular exercise, a healthy diet, rest, and social  support may also be strongly recommended.  Postpartum psychosis is more serious and needs treatment right away. Hospitalization is often needed. HOME CARE INSTRUCTIONS  Get as much rest as you can. Nap when the baby sleeps.  Exercise regularly. Some women find yoga and walking to be beneficial.  Eat a balanced and nourishing diet.  Do little things that you enjoy. Have a cup of tea, take a bubble bath, read your favorite magazine, or listen to your favorite music.  Avoid alcohol.  Ask for help with household chores, cooking, grocery shopping, or running errands as needed. Do not try to do everything.  Talk to people close to you about how you are feeling. Get support from your partner,  family members, friends, or other new moms.  Try to stay positive in how you think. Think about the things you are grateful for.  Do not spend a lot of time alone.  Only take medicine as directed by your caregiver.  Keep all your postpartum appointments.  Let your caregiver know if you have any concerns. SEEK MEDICAL CARE IF: You are having a reaction or problems with your medicine. SEEK IMMEDIATE MEDICAL CARE IF:  You have suicidal feelings.  You feel you may harm the baby or someone else. Document Released: 08/15/2004 Document Revised: 02/03/2012 Document Reviewed: 09/17/2011 [facility] Patient Information 2014 [place], [place].   Discharge to: home  Follow-up Information   Follow up with [facility] In 5 weeks.   Specialty:  Obstetrics and Gynecology   Contact information:   [address] [place] [address] [phone]       Malissa Hippo 11/29/2013

## 2014-02-17 ENCOUNTER — Other Ambulatory Visit: Payer: Self-pay | Admitting: Obstetrics and Gynecology

## 2014-06-11 IMAGING — US US OB COMP +14 WK
2 series · 12 of 28 positions shown · non-contrast
Comparison: none

[Series 1: us ob follow up · 36 acquisitions, 8 frames shown (1 of 2)]
[im 2/36]
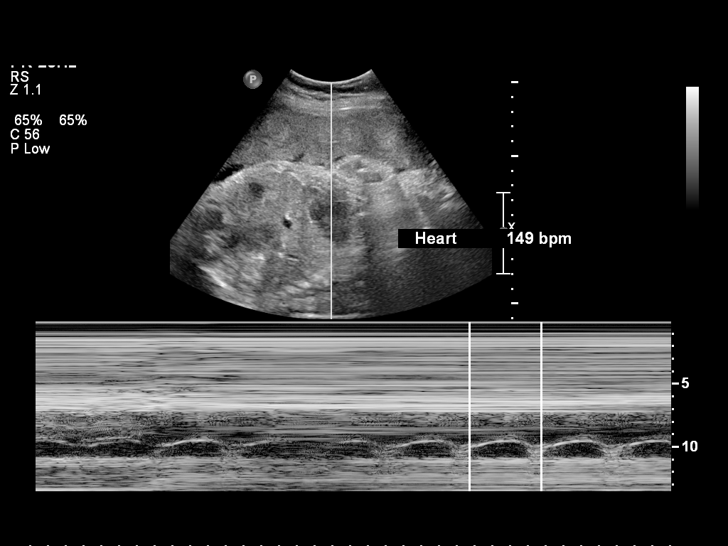
[im 6/36]
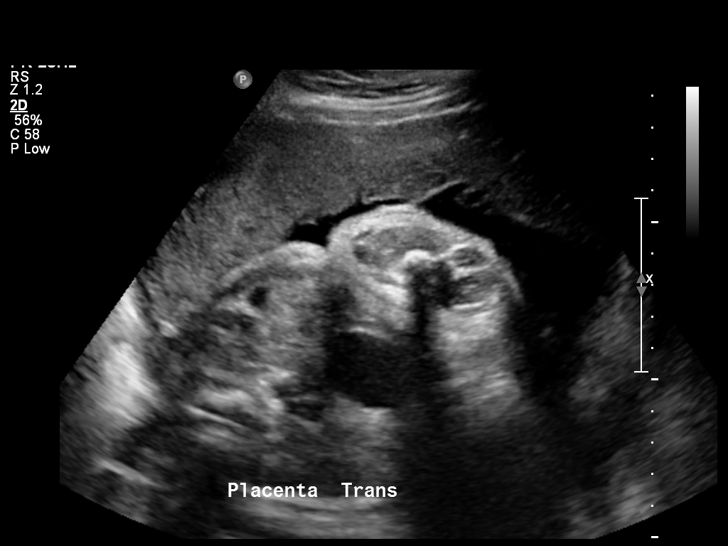
[im 10/36]
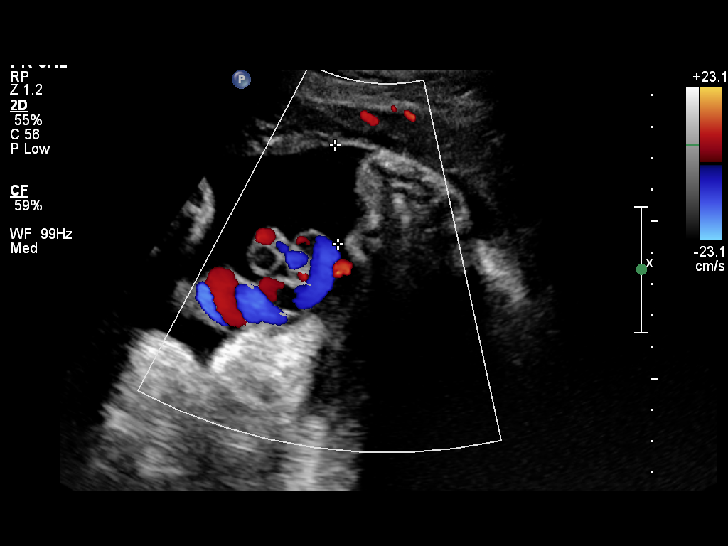
[im 16/36]
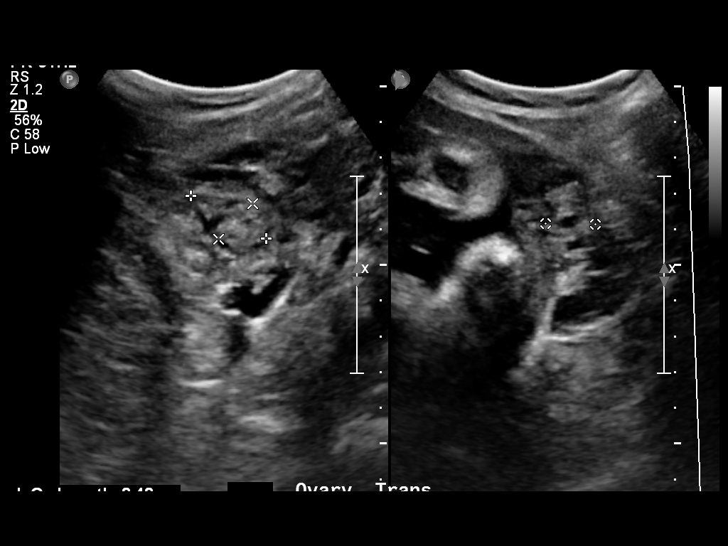
[im 20/36]
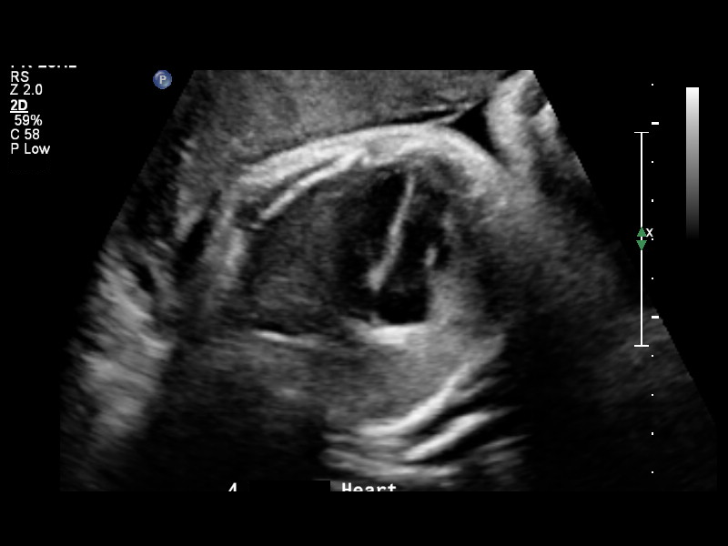
[im 24/36]
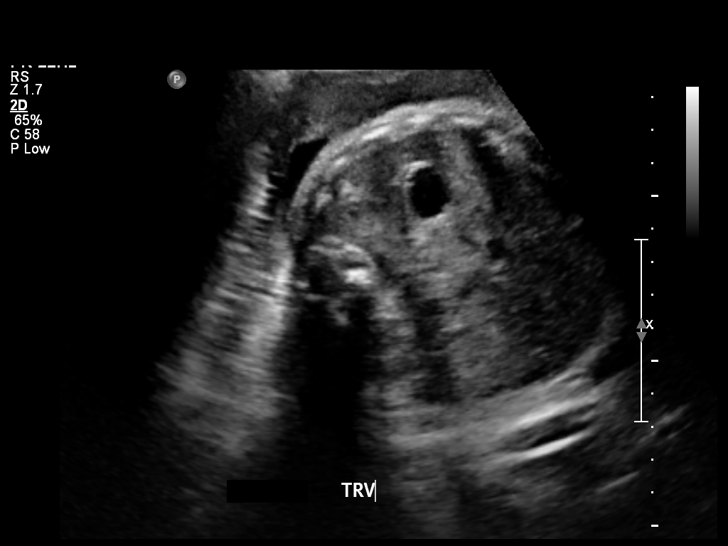
[im 30/36]
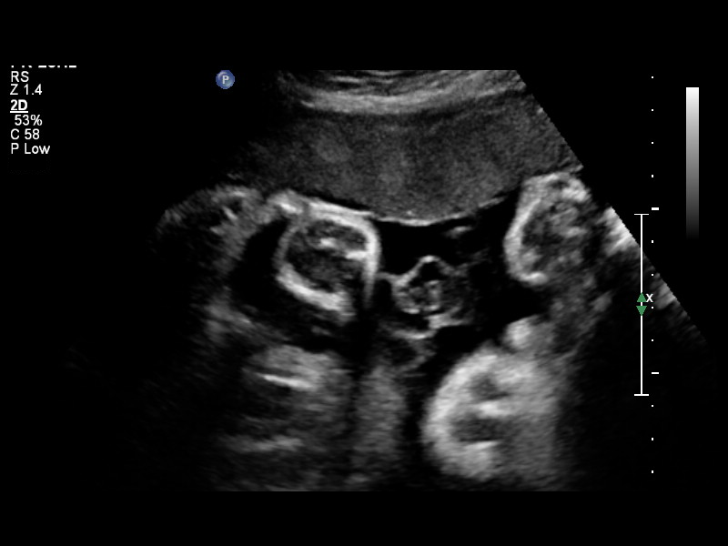
[im 34/36]
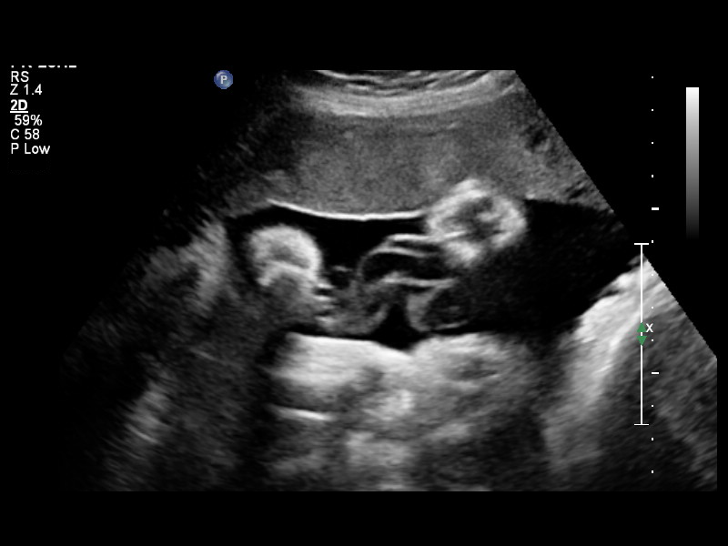

[Series 1: us ob follow up · 4 of 18 slices shown (2 of 2)]
[im 1/18]
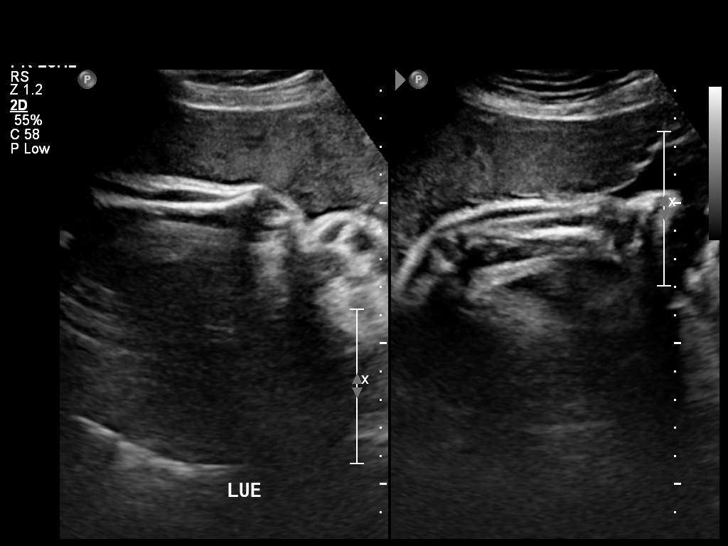
[im 7/18]
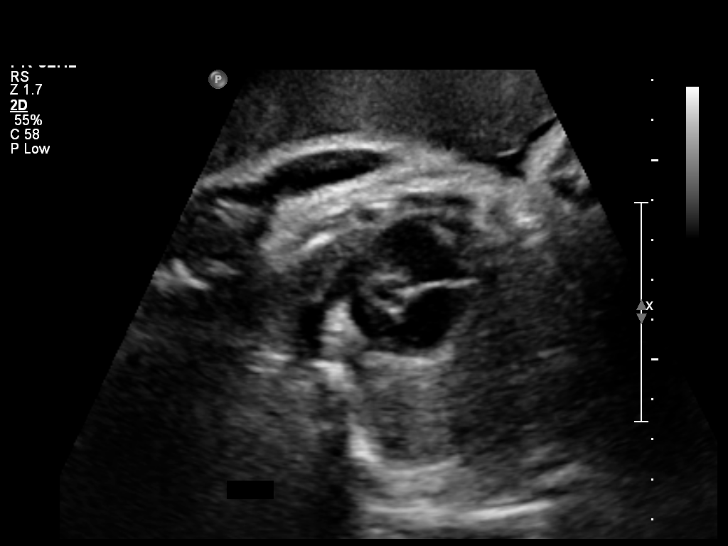
[im 11/18]
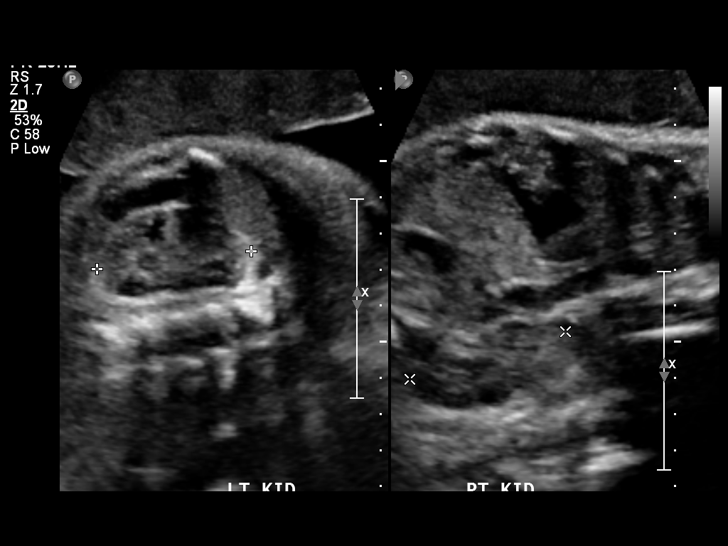
[im 15/18]
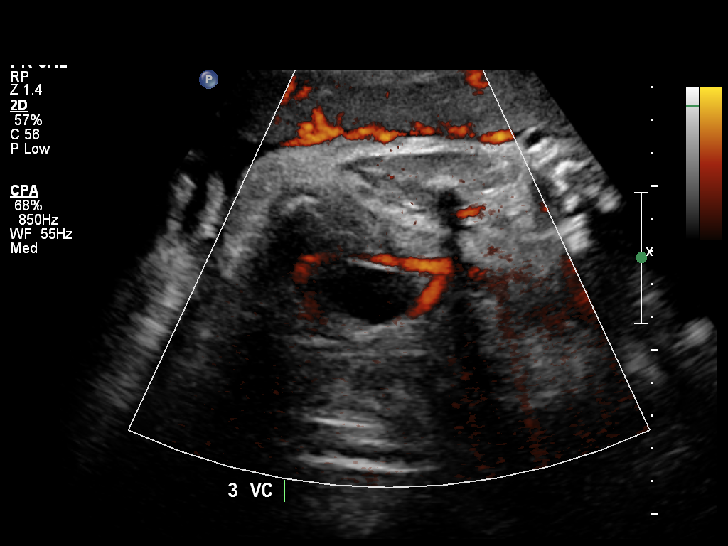

[12 of 28 positions shown; findings below may reference images not displayed]

OBSTETRICS REPORT
                      (Signed Final 08/17/2012 [DATE])

             DVESTO

Service(s) Provided

 US OB COMP + 14 WK                                    76805.1
Indications

 Pelvic / Abdominal pain
 Assess Fetal Growth / Estimated Fetal Weight
 Non-reactive NST (post medication)
 Assess fetal well being
 Late Prenatal Care
Fetal Evaluation

 Num Of Fetuses:    1
 Fetal Heart Rate:  149                         bpm
 Cardiac Activity:  Observed
 Presentation:      Cephalic
 Placenta:          Anterior, above cervical os
 P. Cord            Not well visualized
 Insertion:

 Comment:    BPP score [DATE] in 30 minutes. No fetal breathing seen on
             today's exam.

 Amniotic Fluid
 AFI FV:      Subjectively within normal limits
 AFI Sum:     14.56   cm      52   %Tile     Larg Pckt:   7.88   cm
 RUQ:   1.08   cm    RLQ:    2.47   cm    LUQ:   7.88    cm   LLQ:    3.13   cm
Biophysical Evaluation

 Amniotic F.V:   Pocket => 2 cm two         F. Tone:        Observed
                 planes
 F. Movement:    Observed                   Score:          [DATE]
 F. Breathing:   Not Observed
Biometry

 BPD:     92.8  mm    G. Age:   37w 5d                CI:        84.95   70 - 86
                                                      FL/HC:      19.7   19.4 -

 HC:     317.2  mm    G. Age:   35w 4d       56  %    HC/AC:      0.97   0.96 -

 AC:     327.8  mm    G. Age:   36w 5d     > 97  %    FL/BPD:     67.2   71 - 87
 FL:      62.4  mm    G. Age:   32w 2d        8  %    FL/AC:      19.0   20 - 24
 Est. FW:    2703  gm          6 lb      83  %
Gestational Age

 Clinical EDD:  34w 0d                                        EDD:   09/27/12
 U/S Today:     35w 4d                                        EDD:   09/16/12
 Best:          34w 0d    Det. By:   Clinical EDD             EDD:   09/27/12
Anatomy

 Cranium:          Appears normal         Aortic Arch:      Basic anatomy
                                                            exam per order
 Fetal Cavum:      Not well visualized    Ductal Arch:      Basic anatomy
                                                            exam per order
 Ventricles:       Not well visualized    Diaphragm:        Basic anatomy
                                                            exam per order
 Choroid Plexus:   Appears normal         Stomach:          Appears normal, left
                                                            sided
 Cerebellum:       Not well visualized    Abdomen:          Appears normal
 Posterior Fossa:  Not well visualized    Abdominal Wall:   Not well visualized
 Nuchal Fold:      Not applicable (>20    Cord Vessels:     Appears normal (3
                   wks GA)                                  vessel cord)
 Face:             Not well visualized    Kidneys:          Appear normal
 Lips:             Appears normal         Bladder:          Appears normal
 Heart:            Appears normal         Spine:            Not well visualized
                   (4CH, axis, and
                   situs)
 RVOT:             Appears normal         Lower             Visualized
                                          Extremities:
 LVOT:             Appears normal         Upper             Visualized
                                          Extremities:

 Other:  Fetus appears to be a male. Technically difficult due to advanced GA
         and fetal position. Complete fetal anatomic survey previously
         performed in [REDACTED].
Cervix Uterus Adnexa

 Cervix:       Not visualized (advanced GA >34 wks)

 Left Ovary:   Size(cm) L: 2.42 x W: 1.39 x H: 1.37  Volume(cc):
 Right Ovary:  Size(cm) L: 3.51 x W: 1.63 x H: 1.14  Volume(cc):
 Adnexa:     No abnormality visualized.
Impression

 Single living IUP with assigned GA of 34w 0d in cephalic
 position.
 Technically difficult due to advanced GA. No fetal anatomic
 abnormality identified. EFW is at the 83rd percentile, with
 fetal abdominal circumference >97th percentile.
 BPP is [DATE]. No sustained fetal breathing was seen.
 Normal amniotic fluid volume.
Recommendations

 Consider follow-up ultrasound for fetal growth given the large
 fetal abdominal circumference and EFW at the 83rd
 percentile.

## 2014-08-18 ENCOUNTER — Ambulatory Visit (INDEPENDENT_AMBULATORY_CARE_PROVIDER_SITE_OTHER): Payer: Self-pay | Admitting: Family Medicine

## 2014-08-18 VITALS — BP 102/68 | HR 67 | Temp 97.8°F | Resp 18 | Ht 64.5 in | Wt 135.0 lb

## 2014-08-18 DIAGNOSIS — N200 Calculus of kidney: Secondary | ICD-10-CM

## 2014-08-18 DIAGNOSIS — R350 Frequency of micturition: Secondary | ICD-10-CM

## 2014-08-18 DIAGNOSIS — R109 Unspecified abdominal pain: Secondary | ICD-10-CM

## 2014-08-18 LAB — POCT UA - MICROSCOPIC ONLY
CRYSTALS, UR, HPF, POC: NEGATIVE
Casts, Ur, LPF, POC: NEGATIVE
MUCUS UA: NEGATIVE
WBC, Ur, HPF, POC: NEGATIVE
Yeast, UA: NEGATIVE

## 2014-08-18 LAB — POCT URINALYSIS DIPSTICK
Glucose, UA: NEGATIVE
KETONES UA: NEGATIVE
Leukocytes, UA: NEGATIVE
Nitrite, UA: NEGATIVE
Protein, UA: 100
Urobilinogen, UA: 0.2
pH, UA: 5.5

## 2014-08-18 MED ORDER — KETOROLAC TROMETHAMINE 10 MG PO TABS: 10.0000 mg | ORAL_TABLET | Freq: Four times a day (QID) | ORAL | Status: DC | PRN

## 2014-08-18 MED ORDER — PROMETHAZINE HCL 25 MG PO TABS: 25.0000 mg | ORAL_TABLET | Freq: Three times a day (TID) | ORAL | Status: DC | PRN

## 2014-08-18 MED ORDER — CIPROFLOXACIN HCL 500 MG PO TABS: 500.0000 mg | ORAL_TABLET | Freq: Two times a day (BID) | ORAL | Status: DC

## 2014-08-18 MED ORDER — TAMSULOSIN HCL 0.4 MG PO CAPS: 0.4000 mg | ORAL_CAPSULE | Freq: Every day | ORAL | Status: DC

## 2014-08-18 MED ORDER — OXYCODONE-ACETAMINOPHEN 5-325 MG PO TABS: 1.0000 | ORAL_TABLET | Freq: Three times a day (TID) | ORAL | Status: DC | PRN

## 2014-08-18 NOTE — Progress Notes (Signed)
Subjective:    Patient ID: [name] [name], female    DOB: Jul 04, 1981, 33 y.o.   MRN: [id number] Chief Complaint  Patient presents with  . Abdominal Pain    on going cramping off and on but last night worse   . Urinary Frequency    HPI  Has a h/o prior UTIs but not frequently.  Several days ago she noticed dysuria and pelvic pressure and menstrual cramps but last night worsened and pain is there constantly - suprapubic.  Urine is brown.  Increased urinary frequency but no urgency, does have some hesitancy and difficulty urinating.  No f/c, no n/v, having some right flank pain but pain is more suprapubic.  Has a mirena IUD and having a little vaginal discharge.  No recent antibiotics. Has been using tylenol prn but no other meds.  Past Medical History  Diagnosis Date  . HPV (human papilloma virus) infection   . Abnormal Pap smear 2000    COLPO X2; LAST PAP 2012  . Infection 2009    UTI   . Kidney stones 2009  . Headache(784.0)     FREQUENT  . Fracture of right hand 2012  . Anemia 09/23/2012   No current outpatient prescriptions on file prior to visit.   No current facility-administered medications on file prior to visit.   No Known Allergies   Review of Systems  Constitutional: Negative for fever, chills, diaphoresis, activity change, appetite change, fatigue and unexpected weight change.  Gastrointestinal: Positive for abdominal pain. Negative for nausea, vomiting, diarrhea, constipation, blood in stool, anal bleeding and rectal pain.  Genitourinary: Positive for dysuria, frequency, hematuria, flank pain, vaginal discharge, difficulty urinating, menstrual problem and pelvic pain. Negative for urgency, decreased urine volume, vaginal bleeding, enuresis, genital sores, vaginal pain and dyspareunia.  Musculoskeletal: Negative for gait problem.  Skin: Negative for rash.  Hematological: Negative for adenopathy.  Psychiatric/Behavioral: The patient is not  nervous/anxious.        Objective:  BP 102/68  Pulse 67  Temp(Src) 97.8 F (36.6 C) (Oral)  Resp 18  Ht 5' 4.5" (1.638 m)  Wt 135 lb (61.236 kg)  BMI 22.82 kg/m2  SpO2 100%  Physical Exam  Constitutional: She is oriented to person, place, and time. She appears well-developed and well-nourished. No distress.  HENT:  Head: Normocephalic and atraumatic.  Cardiovascular: Normal rate, regular rhythm, normal heart sounds and intact distal pulses.   Pulmonary/Chest: Effort normal and breath sounds normal.  Abdominal: Soft. Bowel sounds are normal. She exhibits no distension. There is no hepatosplenomegaly. There is no tenderness. There is no rebound, no guarding and no CVA tenderness.  Neurological: She is alert and oriented to person, place, and time.  Skin: Skin is warm and dry. She is not diaphoretic.  Psychiatric: She has a normal mood and affect. Her behavior is normal.          Assessment & Plan:   Abdominal cramping - Plan: POCT urinalysis dipstick, POCT UA - Microscopic Only  Frequent urination - Plan: POCT urinalysis dipstick, POCT UA - Microscopic Only  Kidney stone - 2010  Recurrent nephrolithiasis  Meds ordered this encounter  Medications  . levonorgestrel (MIRENA) 20 MCG/24HR IUD    Sig: 1 each by Intrauterine route once.  . ciprofloxacin (CIPRO) 500 MG tablet    Sig: Take 1 tablet (500 mg total) by mouth 2 (two) times daily.    Dispense:  14 tablet    Refill:  0  . oxyCODONE-acetaminophen (ROXICET)  5-325 MG per tablet    Sig: Take 1 tablet by mouth every 8 (eight) hours as needed for severe pain.    Dispense:  40 tablet    Refill:  0  . tamsulosin (FLOMAX) 0.4 MG CAPS capsule    Sig: Take 1 capsule (0.4 mg total) by mouth daily.    Dispense:  30 capsule    Refill:  0  . promethazine (PHENERGAN) 25 MG tablet    Sig: Take 1 tablet (25 mg total) by mouth every 8 (eight) hours as needed for nausea or vomiting.    Dispense:  20 tablet    Refill:  0  .  ketorolac (TORADOL) 10 MG tablet    Sig: Take 1 tablet (10 mg total) by mouth every 6 (six) hours as needed (for kidneys stone pain).    Dispense:  20 tablet    Refill:  0   Norberto Sorenson, MD MPH  Results for orders placed in visit on 08/18/14  POCT URINALYSIS DIPSTICK      Result Value Ref Range   Color, UA Brown     Clarity, UA slightly cloudy     Glucose, UA Neg     Bilirubin, UA small     Ketones, UA neg     Spec Grav, UA >=1.030     Blood, UA large     pH, UA 5.5     Protein, UA 100     Urobilinogen, UA 0.2     Nitrite, UA neg     Leukocytes, UA Negative    POCT UA - MICROSCOPIC ONLY      Result Value Ref Range   WBC, Ur, HPF, POC Neg     RBC, urine, microscopic TNTC     Bacteria, U Microscopic 2+     Mucus, UA Neg     Epithelial cells, urine per micros 0-1     Crystals, Ur, HPF, POC Neg     Casts, Ur, LPF, POC Neg     Yeast, UA Neg

## 2014-08-18 NOTE — Patient Instructions (Signed)
Kidney Stones Kidney stones (urolithiasis) are deposits that form inside your kidneys. The intense pain is caused by the stone moving through the urinary tract. When the stone moves, the ureter goes into spasm around the stone. The stone is usually passed in the urine.  CAUSES   A disorder that makes certain neck glands produce too much parathyroid hormone (primary hyperparathyroidism).  A buildup of uric acid crystals, similar to gout in your joints.  Narrowing (stricture) of the ureter.  A kidney obstruction present at birth (congenital obstruction).  Previous surgery on the kidney or ureters.  Numerous kidney infections. SYMPTOMS   Feeling sick to your stomach (nauseous).  Throwing up (vomiting).  Blood in the urine (hematuria).  Pain that usually spreads (radiates) to the groin.  Frequency or urgency of urination. DIAGNOSIS   Taking a history and physical exam.  Blood or urine tests.  CT scan.  Occasionally, an examination of the inside of the urinary bladder (cystoscopy) is performed. TREATMENT   Observation.  Increasing your fluid intake.  Extracorporeal shock wave lithotripsy--This is a noninvasive procedure that uses shock waves to break up kidney stones.  Surgery may be needed if you have severe pain or persistent obstruction. There are various surgical procedures. Most of the procedures are performed with the use of small instruments. Only small incisions are needed to accommodate these instruments, so recovery time is minimized. The size, location, and chemical composition are all important variables that will determine the proper choice of action for you. Talk to your health care provider to better understand your situation so that you will minimize the risk of injury to yourself and your kidney.  HOME CARE INSTRUCTIONS   Drink enough water and fluids to keep your urine clear or pale yellow. This will help you to pass the stone or stone fragments.  Strain  all urine through the provided strainer. Keep all particulate matter and stones for your health care provider to see. The stone causing the pain may be as small as a grain of salt. It is very important to use the strainer each and every time you pass your urine. The collection of your stone will allow your health care provider to analyze it and verify that a stone has actually passed. The stone analysis will often identify what you can do to reduce the incidence of recurrences.  Only take over-the-counter or prescription medicines for pain, discomfort, or fever as directed by your health care provider.  Make a follow-up appointment with your health care provider as directed.  Get follow-up X-rays if required. The absence of pain does not always mean that the stone has passed. It may have only stopped moving. If the urine remains completely obstructed, it can cause loss of kidney function or even complete destruction of the kidney. It is your responsibility to make sure X-rays and follow-ups are completed. Ultrasounds of the kidney can show blockages and the status of the kidney. Ultrasounds are not associated with any radiation and can be performed easily in a matter of minutes. SEEK MEDICAL CARE IF:  You experience pain that is progressive and unresponsive to any pain medicine you have been prescribed. SEEK IMMEDIATE MEDICAL CARE IF:   Pain cannot be controlled with the prescribed medicine.  You have a fever or shaking chills.  The severity or intensity of pain increases over 18 hours and is not relieved by pain medicine.  You develop a new onset of abdominal pain.  You feel faint or pass out.  You are unable to urinate. MAKE SURE YOU:   Understand these instructions.  Will watch your condition.  Will get help right away if you are not doing well or get worse. Document Released: 11/11/2005 Document Revised: 07/14/2013 Document Reviewed: 04/14/2013 [facility] Patient Information 2015  [place], [place]. This information is not intended to replace advice given to you by your health care provider. Make sure you discuss any questions you have with your health care provider.   A variety of dietary modifications can reduce the likelihood of recurrence of calcium oxalate stones. From the viewpoint of diet, increasing the intake of fluid, dietary calcium, potassium and phytate and decreasing the intake of oxalate, animal protein, sucrose, fructose, sodium, supplemental calcium and supplemental vitamin C may be beneficial. Increasing fluid intake, spread throughout the day will be helpful. Grapefruit juice may be associated with an increased risk of stones. Consider avoiding grapefruit and grapefruit juice. Coffee, tea, and alcohol have been reported to be associated with a lower risk of stones so you do not need to avoid there drinks. Cranberry juice can actually increase the urinary saturation of calcium oxalate when ingested in large amounts (of more than one liter per day) and make stones worse. Ingestion of moderate amounts of cranberry juice is unlikely to be harmful, but it is certainly NOT beneficial for stone prevention.  Avoid calorie-containing beverages, such as sweetened soda, to avoid weight gain with the general increase in fluid intake.   Reduce animal protein intake. Try to get most of your protein through vegetables - like beans and soy. Based upon the available data, it would be prudent to avoid excessive animal protein intake (low meat diet).   Increase fruit and vegetable intake. Foods that are rich in potassium, particularly fruits and vegetables, may be beneficial and reduce the risk of calcium oxalate stone formation.  Make sure you increase your diet in potassium - high potassium foods are: Bran cereals and other bran products, Milk (skim, 1%), Yogurt, Avocados, Bananas, Dried fruits, Kiwis, Oranges, Prunes, Raisins, Baked beans, Tomatoes, Peanut butter, Nuts, and Tofu.   Other foods that have some potassium in them but not quite as much are Cherries, Mangoes, Asparagus, Peas, Zucchini, Celery, Cantaloupes, Fresh peaches, Broccoli stalks, Peppers, Figs, Fresh pears, and Summer squashes.  Make sure you eat several of these items daily.  Limit dietary oxalate intake - Some foods contain very large amounts of oxalate and those should be avoided (eg spinach, rhubarb). In addition, some nuts and legumes are also high in oxalate and the intake should be limited (eg, peanuts, cashews, and almonds). The oxalate content of foods is available at the following website: [employer].  As noted above, some foods traditionally believed to be high in oxalate, such as tea, do not increase the risk of stone formation.  Limit sodium intake to 80 to 100 meq/day.  Limit sucrose and fructose intake.  Restricting dietary calcium intake is NOT recommended unless it is excessive (more than 2000 mg/d).  It should be noted that calcium supplements do NOT appear to be effective in preventing recurrent stones and may even slightly increase risk so get plenty of calcium in your diet but do not take a calcium supplement.  High dose vitamin C supplements should be avoided. Phytate appears to decrease the risk of stones in women. Although not simply a matter of dietary intake, higher body mass index increases the risk of stone formation, particularly in women. Therefore, weight control may be helpful in preventing stone recurrence.

## 2014-08-26 IMAGING — US US RENAL
1 series · 14 of 25 positions shown · non-contrast
Comparison: Previous imaging examinations.

CLINICAL DATA: Left flank pain.  History of kidney stones.

RENAL/URINARY TRACT ULTRASOUND COMPLETE

[Series 1: us renal · 14 of 47 slices shown]
[im 1/47]
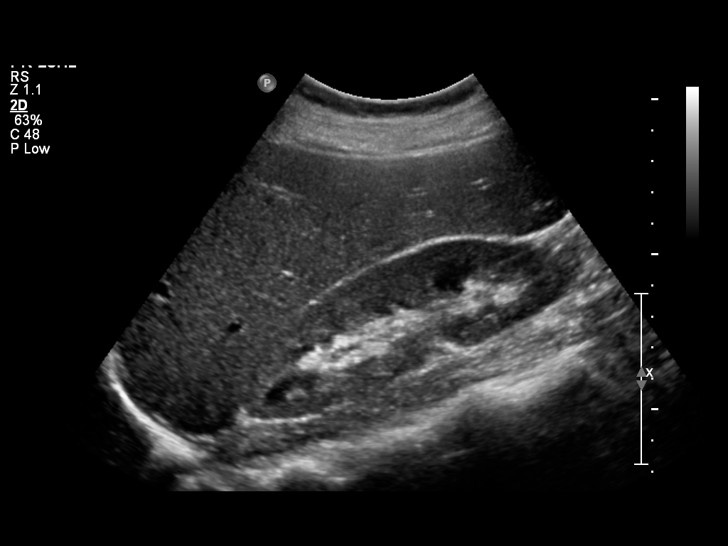
[im 4/47]
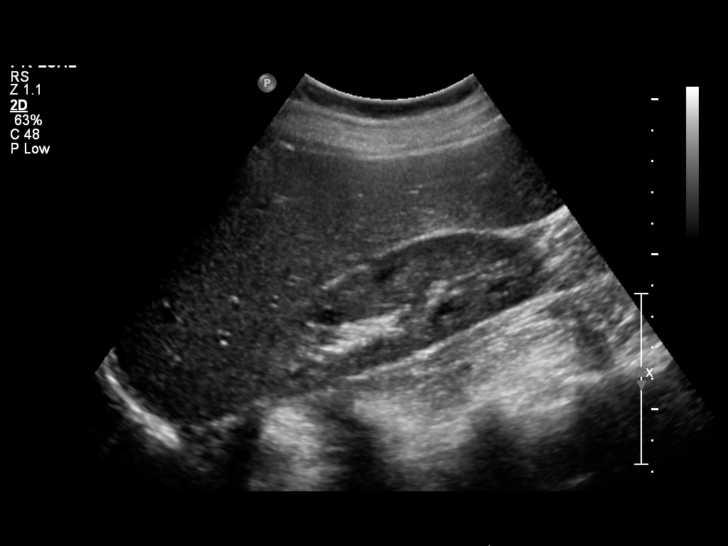
[im 8/47]
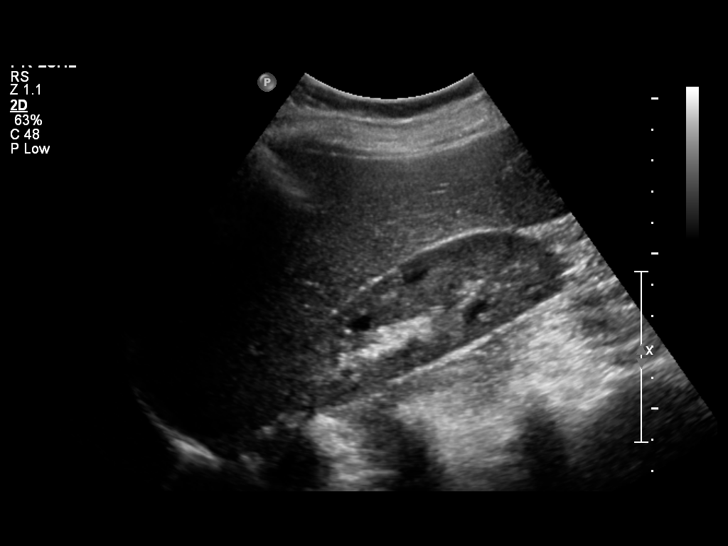
[im 12/47]
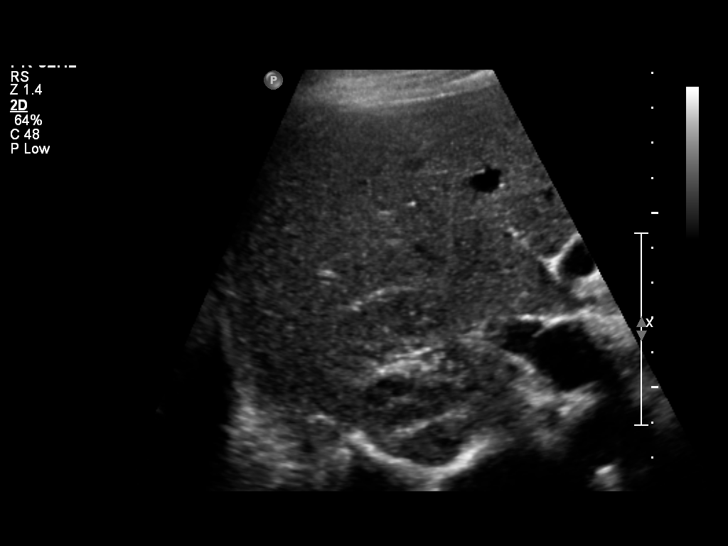
[im 16/47]
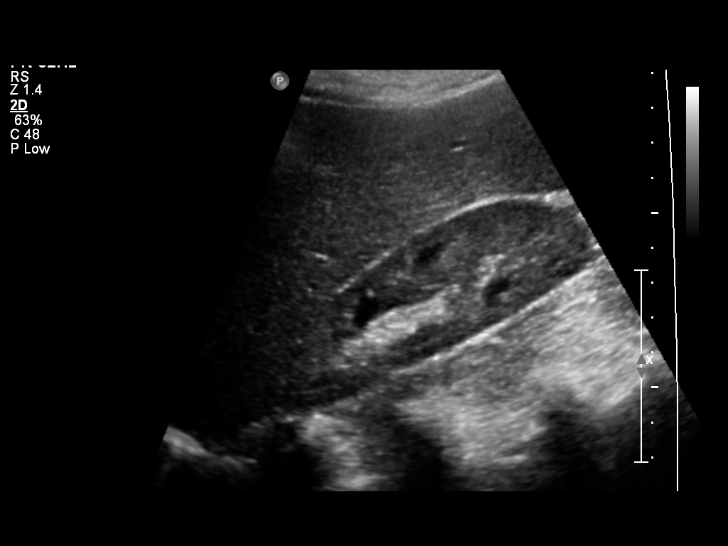
[im 18/47]
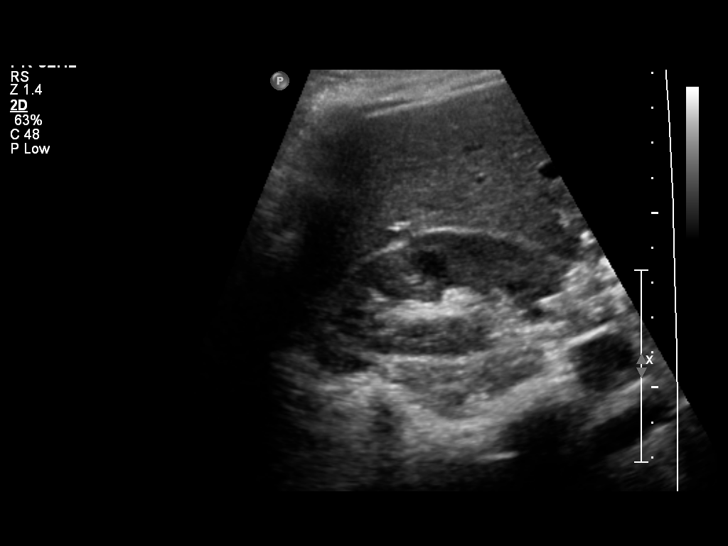
[im 22/47]
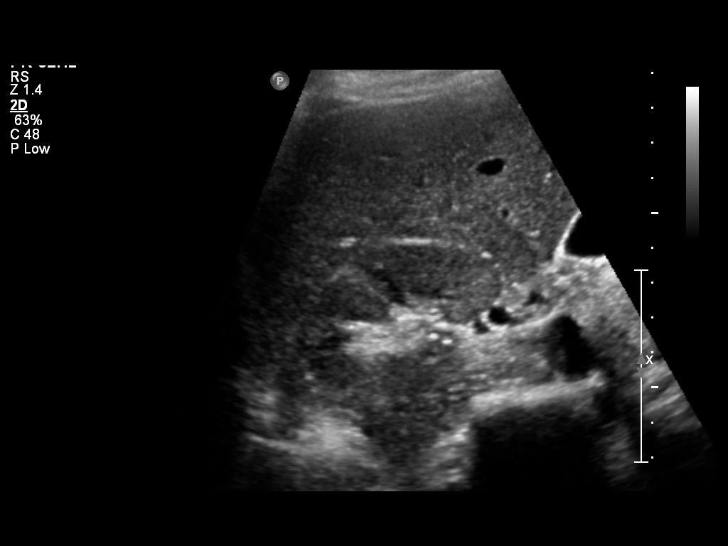
[im 25/47]
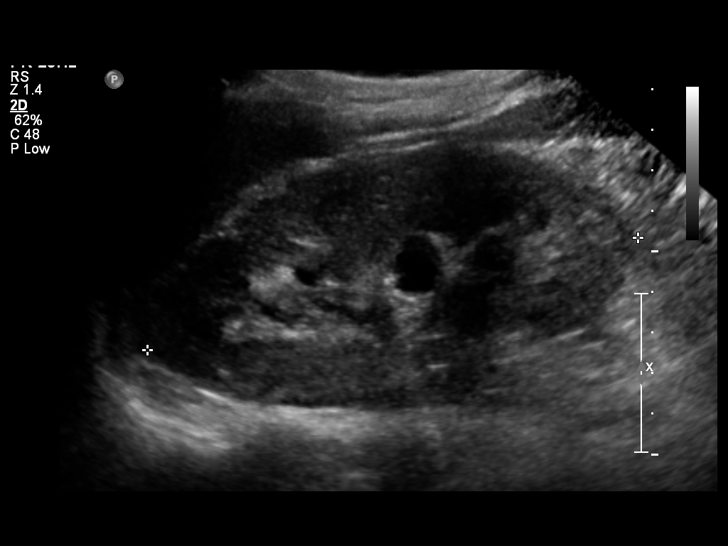
[im 29/47]
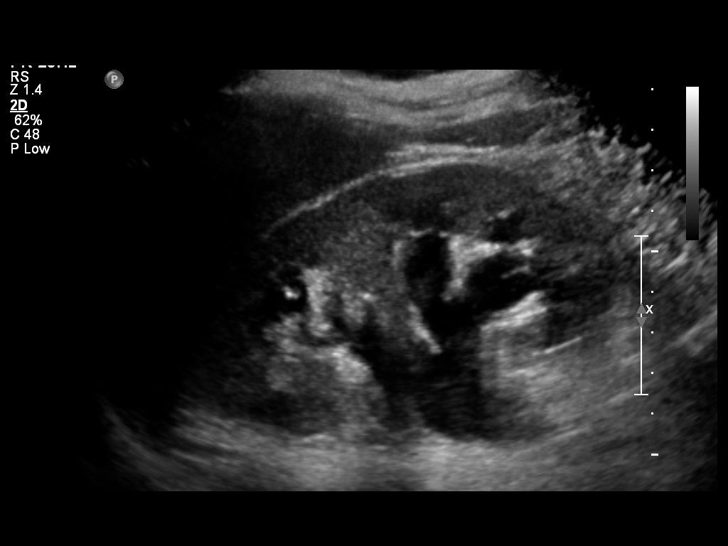
[im 31/47]
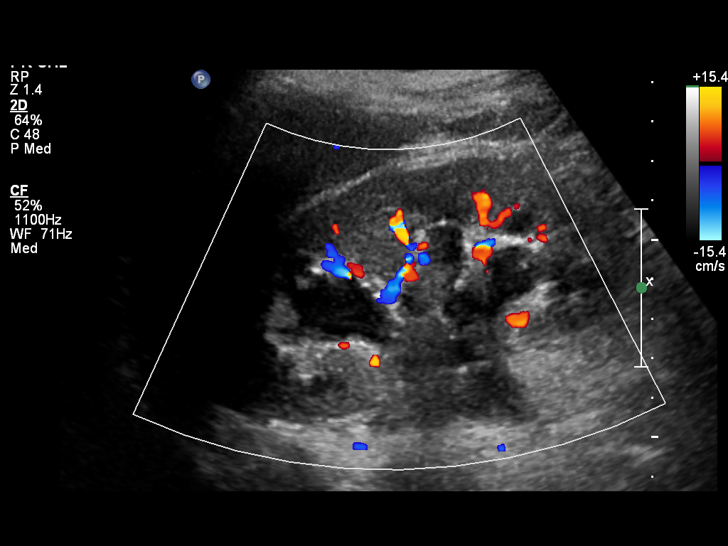
[im 35/47]
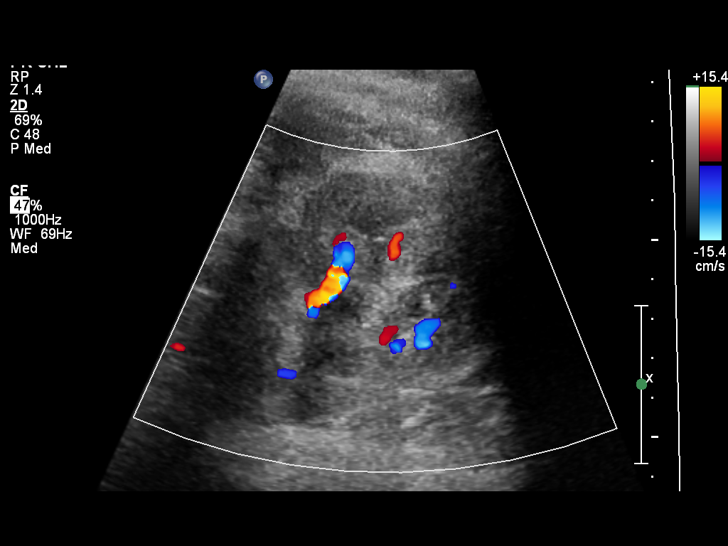
[im 39/47]
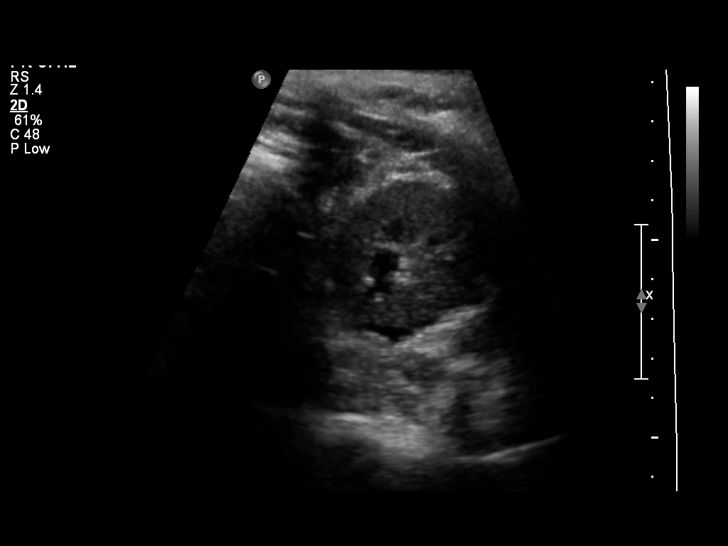
[im 43/47]
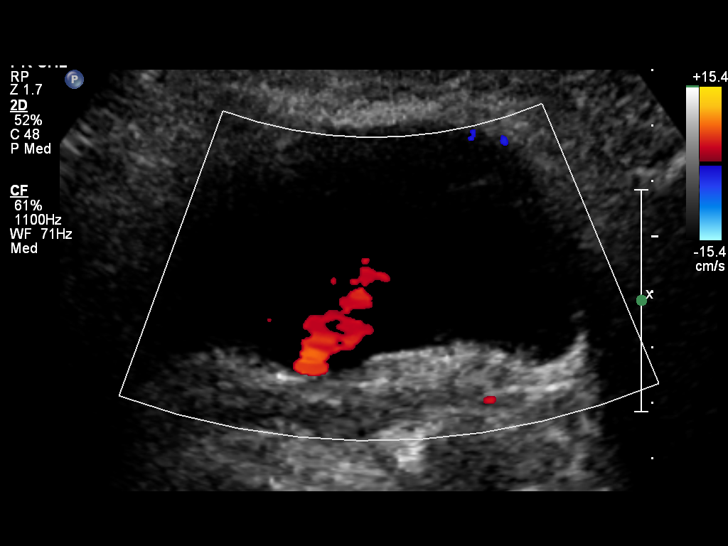
[im 47/47]
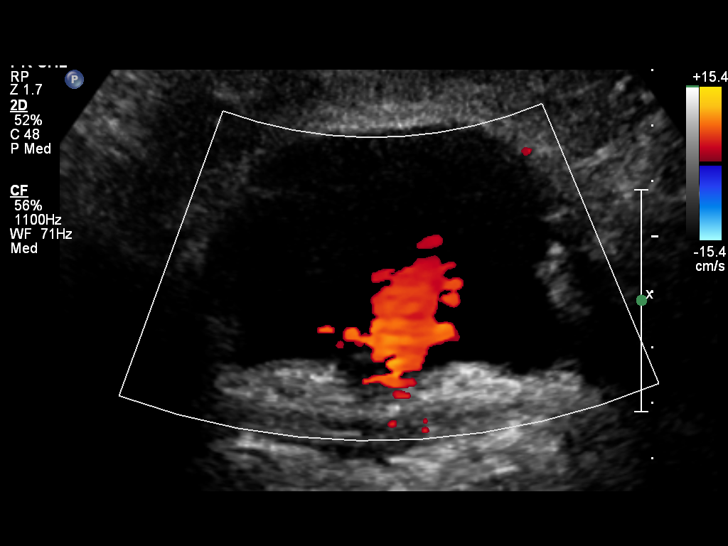

[14 of 25 positions shown; findings below may reference images not displayed]

FINDINGS: Right Kidney:  Normal, measuring 11.5 cm in length.  A tiny
calculus seen on the CT dated 08/02/2009 is not visible.

Left Kidney:  Moderately dilated renal collecting system.
Otherwise, normal, measuring 12.4 cm in length.

Bladder:  Normal.  Only the right ureteral jet was visible.
IMPRESSION: Moderate left hydronephrosis, most likely due to a non-visualized
obstructing left ureteral calculus.

## 2014-10-18 ENCOUNTER — Ambulatory Visit (INDEPENDENT_AMBULATORY_CARE_PROVIDER_SITE_OTHER): Payer: Self-pay | Admitting: Physician Assistant

## 2014-10-18 VITALS — BP 100/62 | HR 94 | Temp 98.2°F | Resp 16 | Ht 63.5 in | Wt 131.4 lb

## 2014-10-18 DIAGNOSIS — R1013 Epigastric pain: Secondary | ICD-10-CM

## 2014-10-18 DIAGNOSIS — K219 Gastro-esophageal reflux disease without esophagitis: Secondary | ICD-10-CM

## 2014-10-18 MED ORDER — RANITIDINE HCL 150 MG PO TABS: 150.0000 mg | ORAL_TABLET | Freq: Two times a day (BID) | ORAL | Status: DC

## 2014-10-18 MED ORDER — OMEPRAZOLE 40 MG PO CPDR: 40.0000 mg | DELAYED_RELEASE_CAPSULE | Freq: Every day | ORAL | Status: DC

## 2014-10-18 MED ORDER — SUCRALFATE 1 GM/10ML PO SUSP: 1.0000 g | Freq: Three times a day (TID) | ORAL | Status: DC

## 2014-10-18 NOTE — Patient Instructions (Addendum)
I would try to avoid greasy and spicy foods and drinks with caffeine.  Try to not lay down for about 3 hours after you eat.   I will contact you with your lab results as soon as they are available.   If you have not heard from me in 2 weeks, please contact me.  The fastest way to get your results is to register for My Chart (see the instructions on the last page of this printout).

## 2014-10-18 NOTE — Progress Notes (Signed)
Subjective:    Patient ID: [name] [name], female    DOB: 1981-04-23, 33 y.o.   MRN: [id number]  HPI Pt has been having epigastric pain for about the last 10 months - started when she was pregnant (delivered 6/15) and she used OTC medications but it never really helped and it has not gone away.  She actually feels like over the last 2 days it has been worse than normal - it hurts more and it lasting longer than normal.  It is a burning pain in her chest that goes up towards her throat.  She has nausea and will sometimes throw up and at the end it is really bitter.  She wakes up in the am with a bitter taste in her mouth. The pain is worse when she lays down at night.  She had some constipation over the last week but then yesterday she a some diarrhea.  She has tried alka-seltzer and other antiacids and they have not helped.  She took a Prilosec 2 days ago without any relief.  The pain is not cramping and is not always worse with greasy and spicy foods though that is mainly what she eats.  She also drinks a lot of Coke.  Prior to Admission medications   Medication Sig Start Date End Date Taking? Authorizing Provider  levonorgestrel (MIRENA) 20 MCG/24HR IUD 1 each by Intrauterine route once.   Yes Historical Provider, MD  promethazine (PHENERGAN) 25 MG tablet Take 1 tablet (25 mg total) by mouth every 8 (eight) hours as needed for nausea or vomiting. 08/18/14  Yes Sherren MochaEva N Shaw, MD       Morrell RiddleSarah L Shye Doty, PA-C       Morrell RiddleSarah L Amairani Shuey, PA-C       Morrell RiddleSarah L Bobbiejo Ishikawa, PA-C  No Known Allergies  Patient Active Problem List   Diagnosis Date Noted  . Anemia 09/23/2012  . 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 Headache - frequent 04/21/2012     Review of Systems  Constitutional: Negative for fever and chills.  Gastrointestinal: Positive for nausea, vomiting and abdominal pain (epigastric pain). Negative for blood in stool.  Genitourinary: Negative.        Objective:   Physical Exam    Constitutional: She is oriented to person, place, and time. She appears well-developed and well-nourished.  BP 100/62 mmHg  Pulse 94  Temp(Src) 98.2 F (36.8 C) (Oral)  Resp 16  Ht 5' 3.5" (1.613 m)  Wt 131 lb 6 oz (59.591 kg)  BMI 22.90 kg/m2  SpO2 100%  Breastfeeding? No   HENT:  Head: Normocephalic and atraumatic.  Right Ear: External ear normal.  Left Ear: External ear normal.  Cardiovascular: Normal rate, regular rhythm and normal heart sounds.   No murmur heard. Pulmonary/Chest: Effort normal and breath sounds normal. She has no wheezes.  Abdominal: Soft. Bowel sounds are normal. There is tenderness (mild RUQ TTP, mostly tender epigastric region) in the right upper quadrant and epigastric area.  Neurological: She is alert and oriented to person, place, and time.  Skin: Skin is warm and dry.  Psychiatric: She has a normal mood and affect. Her behavior is normal. Judgment and thought content normal.       Assessment & Plan:  Abdominal pain, epigastric - Plan: H. pylori antibody, IgG, COMPLETE METABOLIC PANEL WITH GFR  Gastroesophageal reflux disease, esophagitis presence not specified - Plan: sucralfate (CARAFATE) 1 GM/10ML suspension, ranitidine (ZANTAC) 150 MG tablet, omeprazole (PRILOSEC) 40 MG  capsule   We will check labs to r/o gallbladder disease but this is most likely reflux.  We will treat aggressively due to worsening recently and length of time of symptoms.  We discussed diet changes/motifications that might be helpful to her condition.  She will f/u if she has problems.  Benny LennertSarah Imogean Ciampa PA-C  [facility] 10/18/2014 8:47 PM

## 2014-10-19 ENCOUNTER — Telehealth: Payer: Self-pay | Admitting: *Deleted

## 2014-10-19 DIAGNOSIS — K219 Gastro-esophageal reflux disease without esophagitis: Secondary | ICD-10-CM

## 2014-10-19 LAB — COMPLETE METABOLIC PANEL WITH GFR
ALK PHOS: 95 U/L (ref 39–117)
ALT: 31 U/L (ref 0–35)
AST: 21 U/L (ref 0–37)
Albumin: 4.1 g/dL (ref 3.5–5.2)
BILIRUBIN TOTAL: 0.3 mg/dL (ref 0.2–1.2)
BUN: 10 mg/dL (ref 6–23)
CO2: 27 meq/L (ref 19–32)
Calcium: 8.9 mg/dL (ref 8.4–10.5)
Chloride: 106 mEq/L (ref 96–112)
Creat: 0.71 mg/dL (ref 0.50–1.10)
GFR, Est Non African American: >89 mL/min
GLUCOSE: 91 mg/dL (ref 70–99)
Potassium: 4.1 mEq/L (ref 3.5–5.3)
Sodium: 140 mEq/L (ref 135–145)
Total Protein: 6.8 g/dL (ref 6.0–8.3)

## 2014-10-19 MED ORDER — RANITIDINE HCL 150 MG PO TABS: 150.0000 mg | ORAL_TABLET | Freq: Two times a day (BID) | ORAL | Status: DC

## 2014-10-19 MED ORDER — SUCRALFATE 1 GM/10ML PO SUSP: 1.0000 g | Freq: Three times a day (TID) | ORAL | Status: DC

## 2014-10-19 MED ORDER — OMEPRAZOLE 40 MG PO CPDR: 40.0000 mg | DELAYED_RELEASE_CAPSULE | Freq: Every day | ORAL | Status: DC

## 2014-10-19 NOTE — Telephone Encounter (Signed)
Pt called- pharmacy did not have medications prescribed yesterday. Resent medications to the pharmacy. Pt aware.

## 2014-10-21 LAB — H. PYLORI ANTIBODY, IGG: H Pylori IgG: 1.81 {ISR} — ABNORMAL HIGH

## 2014-10-25 ENCOUNTER — Other Ambulatory Visit: Payer: Self-pay | Admitting: Physician Assistant

## 2014-10-25 MED ORDER — AMOXICILLIN 500 MG PO CAPS: 1000.0000 mg | ORAL_CAPSULE | Freq: Two times a day (BID) | ORAL | Status: DC

## 2014-10-25 MED ORDER — CLARITHROMYCIN 500 MG PO TABS: 500.0000 mg | ORAL_TABLET | Freq: Two times a day (BID) | ORAL | Status: DC

## 2015-01-19 ENCOUNTER — Ambulatory Visit (INDEPENDENT_AMBULATORY_CARE_PROVIDER_SITE_OTHER): Payer: Self-pay | Admitting: Family Medicine

## 2015-01-19 VITALS — BP 104/60 | HR 83 | Temp 98.1°F | Resp 16 | Ht 65.0 in | Wt 129.4 lb

## 2015-01-19 DIAGNOSIS — J029 Acute pharyngitis, unspecified: Secondary | ICD-10-CM

## 2015-01-19 DIAGNOSIS — M5489 Other dorsalgia: Secondary | ICD-10-CM

## 2015-01-19 LAB — POCT UA - MICROSCOPIC ONLY
CASTS, UR, LPF, POC: NEGATIVE
Crystals, Ur, HPF, POC: NEGATIVE
YEAST UA: NEGATIVE

## 2015-01-19 LAB — POCT URINALYSIS DIPSTICK
Bilirubin, UA: NEGATIVE
Glucose, UA: NEGATIVE
Ketones, UA: 40
LEUKOCYTES UA: NEGATIVE
NITRITE UA: NEGATIVE
PH UA: 5
Protein, UA: 30
Urobilinogen, UA: 0.2

## 2015-01-19 LAB — POCT RAPID STREP A (OFFICE): Rapid Strep A Screen: NEGATIVE

## 2015-01-19 MED ORDER — AMOXICILLIN-POT CLAVULANATE 875-125 MG PO TABS: 1.0000 | ORAL_TABLET | Freq: Two times a day (BID) | ORAL | Status: DC

## 2015-01-19 NOTE — Progress Notes (Addendum)
[facility] [address] [place] [address] [phone]  Date:  01/19/2015   Name:  [name] [name]   DOB:  04/08/1981   MRN:  [id number]  PCP:  Esmeralda Arthur, MD    Chief Complaint: Sore Throat; Chills; and Back Pain   History of Present Illness:  [name] [name] is a 34 y.o. very pleasant female patient who presents with the following:  Here today with illness.  She has noted sweats and chills, ST, she notes "white stuff" on her throat.  Sx started 2 days ago.  Also her back aches.  She is not coughing.   She has noted a little diarrhea but no vomiting.  It hurts a lot to swallow and she does not feel very hungry.   At home she tried some tylenol.   She has not noted any dysuria, malorodous urine, no hemautria or frequency.  She is spotting now- she does not get  Regular menses as she has an IUD Her main concern today is the ST  BP Readings from Last 3 Encounters:  01/19/15 88/56  10/18/14 100/62  08/18/14 102/68     Patient Active Problem List   Diagnosis Date Noted  . Anemia 09/23/2012  . 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 Headache - frequent 04/21/2012    Past Medical History  Diagnosis Date  . HPV (human papilloma virus) infection   . Abnormal Pap smear 2000    COLPO X2; LAST PAP 2012  . Infection 2009    UTI   . Kidney stones 2009  . Headache(784.0)     FREQUENT  . Fracture of right hand 2012  . Anemia 09/23/2012    Past Surgical History  Procedure Laterality Date  . Colposcopy    . Appendectomy  2001    DURING PREGNANCY    History  Substance Use Topics  . Smoking status: Never Smoker   . Smokeless tobacco: Never Used  . Alcohol Use: No     Comment: OCCASIONAL MIXED DRINK    Family History  Problem Relation Age of Onset  . Anesthesia problems Neg Hx   . Other Neg Hx   . Depression Son 9    AFTER DEATH OF FATHER  . COPD Paternal Grandfather   . Cancer Father     No Known  Allergies  Medication list has been reviewed and updated.  No current outpatient prescriptions on file prior to visit.   No current facility-administered medications on file prior to visit.    Review of Systems:  As per HPI- otherwise negative.   Physical Examination: Filed Vitals:   01/19/15 1927  BP: 88/56  Pulse: 83  Temp: 98.1 F (36.7 C)  Resp: 16   Filed Vitals:   01/19/15 1927  Height:  (1.651 m)  Weight: 129 lb 6.4 oz (58.695 kg)   Body mass index is 21.53 kg/(m^2). Ideal Body Weight: Weight in (lb) to have BMI = 25: 149.9  GEN: WDWN, NAD, Non-toxic, A & O x 3, looks well HEENT: Atraumatic, Normocephalic. Neck supple. No masses, No LAD.  TM wnl bilaterally, EOMI, oropharynx erythematous but no exudate Ears and Nose: No external deformity. CV: RRR, No M/G/R. No JVD. No thrill. No extra heart sounds. PULM: CTA B, no wheezes, crackles, rhonchi. No retractions. No resp. distress. No accessory muscle use. ABD: S, NT, ND EXTR: No c/c/e NEURO Normal gait.  PSYCH: Normally interactive. Conversant. Not depressed or anxious  appearing.  Calm demeanor.   Results for orders placed or performed in visit on 01/19/15  POCT UA - Microscopic Only  Result Value Ref Range   WBC, Ur, HPF, POC 5-8    RBC, urine, microscopic 25-35    Bacteria, U Microscopic 1+    Mucus, UA 2+    Epithelial cells, urine per micros 3-6    Crystals, Ur, HPF, POC neg    Casts, Ur, LPF, POC neg    Yeast, UA neg   POCT urinalysis dipstick  Result Value Ref Range   Color, UA amber    Clarity, UA cloudy    Glucose, UA neg    Bilirubin, UA neg    Ketones, UA 40    Spec Grav, UA >=1.030    Blood, UA large    pH, UA 5.0    Protein, UA 30    Urobilinogen, UA 0.2    Nitrite, UA neg    Leukocytes, UA Negative   POCT rapid strep A  Result Value Ref Range   Rapid Strep A Screen Negative Negative    Assessment and Plan: Acute pharyngitis, unspecified pharyngitis type - Plan: POCT rapid  strep A, amoxicillin-clavulanate (AUGMENTIN) 875-125 MG per tablet  Right-sided back pain, unspecified location - Plan: POCT UA - Microscopic Only, POCT urinalysis dipstick, amoxicillin-clavulanate (AUGMENTIN) 875-125 MG per tablet, Urine culture  Here today with a ST and back pain.  Urine is suspicious for UTI but she does not have any sx.  Send culture, and will treat with augmentin to cover pharyngitis and most UTI.  Await culture and will follow-up with her   Signed Abbe AmsterdamJessica Jalaiyah Throgmorton, MD  Called 2/27:she reports that she is feeling better.  Let her know that her urine culture was negative

## 2015-01-19 NOTE — Patient Instructions (Signed)
We are going to treat you for possible strep throat with augmentin.  Also, I am going to send your urine for a culture to see if there is a urinary tract infection.  Let me know if you do not feel better in the next couple of days- Sooner if worse.

## 2015-01-21 ENCOUNTER — Encounter: Payer: Self-pay | Admitting: Family Medicine

## 2015-01-21 LAB — URINE CULTURE: Colony Count: 9000

## 2015-03-13 ENCOUNTER — Other Ambulatory Visit: Payer: Self-pay | Admitting: Obstetrics and Gynecology

## 2016-02-12 ENCOUNTER — Ambulatory Visit (INDEPENDENT_AMBULATORY_CARE_PROVIDER_SITE_OTHER): Payer: Self-pay | Admitting: Family Medicine

## 2016-02-12 VITALS — BP 110/70 | HR 100 | Temp 98.9°F | Resp 17 | Ht 64.0 in | Wt 138.8 lb

## 2016-02-12 DIAGNOSIS — H109 Unspecified conjunctivitis: Secondary | ICD-10-CM

## 2016-02-12 DIAGNOSIS — R1011 Right upper quadrant pain: Secondary | ICD-10-CM

## 2016-02-12 DIAGNOSIS — Z87442 Personal history of urinary calculi: Secondary | ICD-10-CM

## 2016-02-12 DIAGNOSIS — N201 Calculus of ureter: Secondary | ICD-10-CM

## 2016-02-12 DIAGNOSIS — R109 Unspecified abdominal pain: Secondary | ICD-10-CM

## 2016-02-12 LAB — POC MICROSCOPIC URINALYSIS (UMFC)

## 2016-02-12 LAB — POCT URINALYSIS DIP (MANUAL ENTRY)
BILIRUBIN UA: NEGATIVE
Glucose, UA: NEGATIVE
Ketones, POC UA: NEGATIVE
Nitrite, UA: NEGATIVE
Spec Grav, UA: 1.025
UROBILINOGEN UA: 1
pH, UA: 6.5

## 2016-02-12 MED ORDER — HYDROCODONE-ACETAMINOPHEN 5-325 MG PO TABS: 1.0000 | ORAL_TABLET | ORAL | Status: DC | PRN

## 2016-02-12 MED ORDER — OFLOXACIN 0.3 % OP SOLN: 1.0000 [drp] | OPHTHALMIC | Status: DC

## 2016-02-12 MED ORDER — TAMSULOSIN HCL 0.4 MG PO CAPS: 0.4000 mg | ORAL_CAPSULE | Freq: Every day | ORAL | Status: DC

## 2016-02-12 NOTE — Patient Instructions (Addendum)
Drink plenty of fluids  Take the tamsulosin 1 at bedtime to try and help relax the ureter so the kidney stone will pass. When you stand up make sure you're not lightheaded before you walk around. When you first start this medicine some people can get lightheaded.  Take the pain pills, hydrocodone/APAP 5/325 one every 4-6 hours only if needed for bad pain.   For mild pain you can take over-the-counter ibuprofen 3 or 4 pills 3 times daily if needed.  Use the ofloxacin eyedrops 1 drop in each eye every 3-4 hours when awake. After 2 days decreased to using 1 drop 4 times daily  Return if either eye or kidney stone is getting worse  Try to strain your urine. If you do catch a stone it is good to bring it in and let us get it analyzed.  Kidney Stones Kidney stones (urolithiasis) are deposits that form inside your kidneys. The intense pain is caused by the stone moving through the urinary tract. When the stone moves, the ureter goes into spasm around the stone. The stone is usually passed in the urine.  CAUSES   A disorder that makes certain neck glands produce too much parathyroid hormone (primary hyperparathyroidism).  A buildup of uric acid crystals, similar to gout in your joints.  Narrowing (stricture) of the ureter.  A kidney obstruction present at birth (congenital obstruction).  Previous surgery on the kidney or ureters.  Numerous kidney infections. SYMPTOMS   Feeling sick to your stomach (nauseous).  Throwing up (vomiting).  Blood in the urine (hematuria).  Pain that usually spreads (radiates) to the groin.  Frequency or urgency of urination. DIAGNOSIS   Taking a history and physical exam.  Blood or urine tests.  CT scan.  Occasionally, an examination of the inside of the urinary bladder (cystoscopy) is performed. TREATMENT   Observation.  Increasing your fluid intake.  Extracorporeal shock wave lithotripsy--This is a noninvasive procedure that uses shock  waves to break up kidney stones.  Surgery may be needed if you have severe pain or persistent obstruction. There are various surgical procedures. Most of the procedures are performed with the use of small instruments. Only small incisions are needed to accommodate these instruments, so recovery time is minimized. The size, location, and chemical composition are all important variables that will determine the proper choice of action for you. Talk to your health care provider to better understand your situation so that you will minimize the risk of injury to yourself and your kidney.  HOME CARE INSTRUCTIONS   Drink enough water and fluids to keep your urine clear or pale yellow. This will help you to pass the stone or stone fragments.  Strain all urine through the provided strainer. Keep all particulate matter and stones for your health care provider to see. The stone causing the pain may be as small as a grain of salt. It is very important to use the strainer each and every time you pass your urine. The collection of your stone will allow your health care provider to analyze it and verify that a stone has actually passed. The stone analysis will often identify what you can do to reduce the incidence of recurrences.  Only take over-the-counter or prescription medicines for pain, discomfort, or fever as directed by your health care provider.  Keep all follow-up visits as told by your health care provider. This is important.  Get follow-up X-rays if required. The absence of pain does not always mean that  the stone has passed. It may have only stopped moving. If the urine remains completely obstructed, it can cause loss of kidney function or even complete destruction of the kidney. It is your responsibility to make sure X-rays and follow-ups are completed. Ultrasounds of the kidney can show blockages and the status of the kidney. Ultrasounds are not associated with any radiation and can be performed easily  in a matter of minutes.  Make changes to your daily diet as told by your health care provider. You may be told to:  Limit the amount of salt that you eat.  Eat 5 or more servings of fruits and vegetables each day.  Limit the amount of meat, poultry, fish, and eggs that you eat.  Collect a 24-hour urine sample as told by your health care provider.You may need to collect another urine sample every 6-12 months. SEEK MEDICAL CARE IF:  You experience pain that is progressive and unresponsive to any pain medicine you have been prescribed. SEEK IMMEDIATE MEDICAL CARE IF:   Pain cannot be controlled with the prescribed medicine.  You have a fever or shaking chills.  The severity or intensity of pain increases over 18 hours and is not relieved by pain medicine.  You develop a new onset of abdominal pain.  You feel faint or pass out.  You are unable to urinate.   This information is not intended to replace advice given to you by your health care provider. Make sure you discuss any questions you have with your health care provider.   Document Released: 11/11/2005 Document Revised: 08/02/2015 Document Reviewed: 04/14/2013 Elsevier Interactive Patient Education 2016 [employer]vier Inc[employer] Bacterial Conjunctivitis Bacterial conjunctivitis, commonly called pink eye, is an inflammation of the clear membrane that covers the white part of the eye (conjunctiva). The inflammation can also happen on the underside of the eyelids. The blood vessels in the conjunctiva become inflamed, causing the eye to become red or pink. Bacterial conjunctivitis may spread easily from one eye to another and from person to person (contagious).  CAUSES  Bacterial conjunctivitis is caused by bacteria. The bacteria may come from your own skin, your upper respiratory tract, or from someone else with bacterial conjunctivitis. SYMPTOMS  The normally white color of the eye or the underside of the eyelid is usually pink or red. The  pink eye is usually associated with irritation, tearing, and some sensitivity to light. Bacterial conjunctivitis is often associated with a thick, yellowish discharge from the eye. The discharge may turn into a crust on the eyelids overnight, which causes your eyelids to stick together. If a discharge is present, there may also be some blurred vision in the affected eye. DIAGNOSIS  Bacterial conjunctivitis is diagnosed by your caregiver through an eye exam and the symptoms that you report. Your caregiver looks for changes in the surface tissues of your eyes, which may point to the specific type of conjunctivitis. A sample of any discharge may be collected on a cotton-tip swab if you have a severe case of conjunctivitis, if your cornea is affected, or if you keep getting repeat infections that do not respond to treatment. The sample will be sent to a lab to see if the inflammation is caused by a bacterial infection and to see if the infection will respond to antibiotic medicines. TREATMENT   Bacterial conjunctivitis is treated with antibiotics. Antibiotic eyedrops are most often used. However, antibiotic ointments are also available. Antibiotics pills are sometimes used. Artificial tears or eye washes may ease  discomfort. HOME CARE INSTRUCTIONS   To ease discomfort, apply a cool, clean washcloth to your eye for 10-20 minutes, 3-4 times a day.  Gently wipe away any drainage from your eye with a warm, wet washcloth or a cotton ball.  Wash your hands often with soap and water. Use paper towels to dry your hands.  Do not share towels or washcloths. This may spread the infection.  Change or wash your pillowcase every day.  You should not use eye makeup until the infection is gone.  Do not operate machinery or drive if your vision is blurred.  Stop using contact lenses. Ask your caregiver how to sterilize or replace your contacts before using them again. This depends on the type of contact lenses that  you use.  When applying medicine to the infected eye, do not touch the edge of your eyelid with the eyedrop bottle or ointment tube. SEEK IMMEDIATE MEDICAL CARE IF:   Your infection has not improved within 3 days after beginning treatment.  You had yellow discharge from your eye and it returns.  You have increased eye pain.  Your eye redness is spreading.  Your vision becomes blurred.  You have a fever or persistent symptoms for more than 2-3 days.  You have a fever and your symptoms suddenly get worse.  You have facial pain, redness, or swelling. MAKE SURE YOU:   Understand these instructions.  Will watch your condition.  Will get help right away if you are not doing well or get worse.   This information is not intended to replace advice given to you by your health care provider. Make sure you discuss any questions you have with your health care provider.   Document Released: 11/11/2005 Document Revised: 12/02/2014 Document Reviewed: 04/13/2012 Elsevier Interactive Patient Education 2016 [employer].     IF you received an x-ray today, you will receive an invoice from [facility]. Please contact [facility] at [phone] with questions or concerns regarding your invoice.   IF you received labwork today, you will receive an invoice from [employer]. Please contact [facility] at [phone] with questions or concerns regarding your invoice.   Our billing staff will not be able to assist you with questions regarding bills from these companies.  You will be contacted with the lab results as soon as they are available. The fastest way to get your results is to activate your My Chart account. Instructions are located on the last page of this paperwork. If you have not heard from [place] regarding the results in 2 weeks, please contact this office.

## 2016-02-12 NOTE — Progress Notes (Signed)
Patient ID: [name] [name], female    DOB: Feb 07, 1981  Age: 35 y.o. MRN: [id number]  Chief Complaint  Patient presents with  . Eye Pain    Left, X Saturday  . Flank Pain    Right, x today    Subjective:   35 year old lady who is here for 2 problems. Her child had a conjunctivitis and now she has it. It is been bothering her for several days in the left eye, with increasing redness and drainage. She thinks she is starting to get it in the right eye also.  She has a history of kidney stones. This morning she started having acute right flank pain that feels like another kidney stone. She has not had nausea or vomiting but has not felt like eating. She is only having moderate pain at this time.  Current allergies, medications, problem list, past/family and social histories reviewed.  Objective:  BP 110/70 mmHg  Pulse 100  Temp(Src) 98.9 F (37.2 C) (Oral)  Resp 17  Ht  (1.626 m)  Wt 138 lb 12.8 oz (62.959 kg)  BMI 23.81 kg/m2  SpO2 98%  No major acute distress. Very inflamed left eye, mildly reddened right eye. Fundi appear benign. No evidence of any foreign body and forcing testing not felt needed.  Tender right CVA. Abdomen also is soft with tenderness in the right mid and upper abdomen.  Assessment & Plan:   Assessment: 1. Bilateral conjunctivitis   2. Acute right flank pain   3. History of kidney stones   4. Ureterolithiasis       Plan: Check urinalysis. Decided to hold off on a kidney x-ray at this time, but may be necessary if she gets worse.  Orders Placed This Encounter  Procedures  . POCT urinalysis dipstick  . POCT Microscopic Urinalysis ([facility])    Meds ordered this encounter  Medications  . HYDROcodone-acetaminophen (NORCO) 5-325 MG tablet    Sig: Take 1 tablet by mouth every 4 (four) hours as needed.    Dispense:  20 tablet    Refill:  0  . ofloxacin (OCUFLOX) 0.3 % ophthalmic solution    Sig: Place 1 drop into both eyes every 4 (four)  hours.    Dispense:  5 mL    Refill:  0  . tamsulosin (FLOMAX) 0.4 MG CAPS capsule    Sig: Take 1 capsule (0.4 mg total) by mouth daily.    Dispense:  15 capsule    Refill:  0    Results for orders placed or performed in visit on 02/12/16  POCT urinalysis dipstick  Result Value Ref Range   Color, UA yellow yellow   Clarity, UA clear clear   Glucose, UA negative negative   Bilirubin, UA negative negative   Ketones, POC UA negative negative   Spec Grav, UA 1.025    Blood, UA moderate (A) negative   pH, UA 6.5    Protein Ur, POC trace (A) negative   Urobilinogen, UA 1.0    Nitrite, UA Negative Negative   Leukocytes, UA Trace (A) Negative  POCT Microscopic Urinalysis ([facility])  Result Value Ref Range   WBC,UR,HPF,POC Few (A) None WBC/hpf   RBC,UR,HPF,POC Many (A) None RBC/hpf   Bacteria Few (A) None, Too numerous to count   Mucus Present (A) Absent   Epithelial Cells, UR Per Microscopy Moderate (A) None, Too numerous to count cells/hpf       Patient Instructions   Drink plenty of fluids  Take the  tamsulosin 1 at bedtime to try and help relax the ureter so the kidney stone will pass. When you stand up make sure you're not lightheaded before you walk around. When you first start this medicine some people can get lightheaded.  Take the pain pills, hydrocodone/APAP 5/325 one every 4-6 hours only if needed for bad pain.   For mild pain you can take over-the-counter ibuprofen 3 or 4 pills 3 times daily if needed.  Use the ofloxacin eyedrops 1 drop in each eye every 3-4 hours when awake. After 2 days decreased to using 1 drop 4 times daily  Return if either eye or kidney stone is getting worse  Try to strain your urine. If you do catch a stone it is good to bring it in and let us get it analyzed.  Kidney Stones Kidney stones (urolithiasis) are deposits that form inside your kidneys. The intense pain is caused by the stone moving through the urinary tract. When the stone moves,  the ureter goes into spasm around the stone. The stone is usually passed in the urine.  CAUSES   A disorder that makes certain neck glands produce too much parathyroid hormone (primary hyperparathyroidism).  A buildup of uric acid crystals, similar to gout in your joints.  Narrowing (stricture) of the ureter.  A kidney obstruction present at birth (congenital obstruction).  Previous surgery on the kidney or ureters.  Numerous kidney infections. SYMPTOMS   Feeling sick to your stomach (nauseous).  Throwing up (vomiting).  Blood in the urine (hematuria).  Pain that usually spreads (radiates) to the groin.  Frequency or urgency of urination. DIAGNOSIS   Taking a history and physical exam.  Blood or urine tests.  CT scan.  Occasionally, an examination of the inside of the urinary bladder (cystoscopy) is performed. TREATMENT   Observation.  Increasing your fluid intake.  Extracorporeal shock wave lithotripsy--This is a noninvasive procedure that uses shock waves to break up kidney stones.  Surgery may be needed if you have severe pain or persistent obstruction. There are various surgical procedures. Most of the procedures are performed with the use of small instruments. Only small incisions are needed to accommodate these instruments, so recovery time is minimized. The size, location, and chemical composition are all important variables that will determine the proper choice of action for you. Talk to your health care provider to better understand your situation so that you will minimize the risk of injury to yourself and your kidney.  HOME CARE INSTRUCTIONS   Drink enough water and fluids to keep your urine clear or pale yellow. This will help you to pass the stone or stone fragments.  Strain all urine through the provided strainer. Keep all particulate matter and stones for your health care provider to see. The stone causing the pain may be as small as a grain of salt. It  is very important to use the strainer each and every time you pass your urine. The collection of your stone will allow your health care provider to analyze it and verify that a stone has actually passed. The stone analysis will often identify what you can do to reduce the incidence of recurrences.  Only take over-the-counter or prescription medicines for pain, discomfort, or fever as directed by your health care provider.  Keep all follow-up visits as told by your health care provider. This is important.  Get follow-up X-rays if required. The absence of pain does not always mean that the stone has passed. It may  have only stopped moving. If the urine remains completely obstructed, it can cause loss of kidney function or even complete destruction of the kidney. It is your responsibility to make sure X-rays and follow-ups are completed. Ultrasounds of the kidney can show blockages and the status of the kidney. Ultrasounds are not associated with any radiation and can be performed easily in a matter of minutes.  Make changes to your daily diet as told by your health care provider. You may be told to:  Limit the amount of salt that you eat.  Eat 5 or more servings of fruits and vegetables each day.  Limit the amount of meat, poultry, fish, and eggs that you eat.  Collect a 24-hour urine sample as told by your health care provider.You may need to collect another urine sample every 6-12 months. SEEK MEDICAL CARE IF:  You experience pain that is progressive and unresponsive to any pain medicine you have been prescribed. SEEK IMMEDIATE MEDICAL CARE IF:   Pain cannot be controlled with the prescribed medicine.  You have a fever or shaking chills.  The severity or intensity of pain increases over 18 hours and is not relieved by pain medicine.  You develop a new onset of abdominal pain.  You feel faint or pass out.  You are unable to urinate.   This information is not intended to replace  advice given to you by your health care provider. Make sure you discuss any questions you have with your health care provider.   Document Released: 11/11/2005 Document Revised: 08/02/2015 Document Reviewed: 04/14/2013 Elsevier Interactive Patient Education 2016 Elsevier Inc. Bacterial Conjunctivitis Bacterial conjunctivitis, commonly called pink eye, is an inflammation of the clear membrane that covers the white part of the eye (conjunctiva). The inflammation can also happen on the underside of the eyelids. The blood vessels in the conjunctiva become inflamed, causing the eye to become red or pink. Bacterial conjunctivitis may spread easily from one eye to another and from person to person (contagious).  CAUSES  Bacterial conjunctivitis is caused by bacteria. The bacteria may come from your own skin, your upper respiratory tract, or from someone else with bacterial conjunctivitis. SYMPTOMS  The normally white color of the eye or the underside of the eyelid is usually pink or red. The pink eye is usually associated with irritation, tearing, and some sensitivity to light. Bacterial conjunctivitis is often associated with a thick, yellowish discharge from the eye. The discharge may turn into a crust on the eyelids overnight, which causes your eyelids to stick together. If a discharge is present, there may also be some blurred vision in the affected eye. DIAGNOSIS  Bacterial conjunctivitis is diagnosed by your caregiver through an eye exam and the symptoms that you report. Your caregiver looks for changes in the surface tissues of your eyes, which may point to the specific type of conjunctivitis. A sample of any discharge may be collected on a cotton-tip swab if you have a severe case of conjunctivitis, if your cornea is affected, or if you keep getting repeat infections that do not respond to treatment. The sample will be sent to a lab to see if the inflammation is caused by a bacterial infection and to see  if the infection will respond to antibiotic medicines. TREATMENT   Bacterial conjunctivitis is treated with antibiotics. Antibiotic eyedrops are most often used. However, antibiotic ointments are also available. Antibiotics pills are sometimes used. Artificial tears or eye washes may ease discomfort. HOME CARE INSTRUCTIONS  To ease discomfort, apply a cool, clean washcloth to your eye for 10-20 minutes, 3-4 times a day.  Gently wipe away any drainage from your eye with a warm, wet washcloth or a cotton ball.  Wash your hands often with soap and water. Use paper towels to dry your hands.  Do not share towels or washcloths. This may spread the infection.  Change or wash your pillowcase every day.  You should not use eye makeup until the infection is gone.  Do not operate machinery or drive if your vision is blurred.  Stop using contact lenses. Ask your caregiver how to sterilize or replace your contacts before using them again. This depends on the type of contact lenses that you use.  When applying medicine to the infected eye, do not touch the edge of your eyelid with the eyedrop bottle or ointment tube. SEEK IMMEDIATE MEDICAL CARE IF:   Your infection has not improved within 3 days after beginning treatment.  You had yellow discharge from your eye and it returns.  You have increased eye pain.  Your eye redness is spreading.  Your vision becomes blurred.  You have a fever or persistent symptoms for more than 2-3 days.  You have a fever and your symptoms suddenly get worse.  You have facial pain, redness, or swelling. MAKE SURE YOU:   Understand these instructions.  Will watch your condition.  Will get help right away if you are not doing well or get worse.   This information is not intended to replace advice given to you by your health care provider. Make sure you discuss any questions you have with your health care provider.   Document Released: 11/11/2005 Document  Revised: 12/02/2014 Document Reviewed: 04/13/2012 Elsevier Interactive Patient Education 2016 [employer].     IF you received an x-ray today, you will receive an invoice from [facility]. Please contact [facility] at [phone] with questions or concerns regarding your invoice.   IF you received labwork today, you will receive an invoice from [employer]. Please contact [facility] at [phone] with questions or concerns regarding your invoice.   Our billing staff will not be able to assist you with questions regarding bills from these companies.  You will be contacted with the lab results as soon as they are available. The fastest way to get your results is to activate your My Chart account. Instructions are located on the last page of this paperwork. If you have not heard from [place] regarding the results in 2 weeks, please contact this office.          Return if symptoms worsen or fail to improve.   HOPPER,DAVID, MD 02/12/2016

## 2016-02-14 ENCOUNTER — Telehealth: Payer: Self-pay

## 2016-02-14 NOTE — Telephone Encounter (Signed)
PATIENT STATES SHE SAW DR. Alwyn RenHOPPER ON Monday FOR AN EYE INFECTION. SHE SAID THE DROPS DOES NOT SEEM TO BE HELPING. IT JUST BURNS HER EYES AND LOOKS LIKE THERE IS MORE SWELLING. SHOULD SHE RETURN TO BE SEEN AGAIN OR CAN WE CALL IN SOMETHING ELSE? BEST PHONE [phone] (CELL)  PHARMACY CHOICE IS [facility] ON [address].  MBC

## 2016-02-14 NOTE — Telephone Encounter (Signed)
Called pt, she states she has an appt with an eye doctor.

## 2016-08-17 ENCOUNTER — Telehealth: Payer: Self-pay | Admitting: Radiology

## 2016-08-17 ENCOUNTER — Ambulatory Visit (INDEPENDENT_AMBULATORY_CARE_PROVIDER_SITE_OTHER): Payer: Self-pay | Admitting: Family Medicine

## 2016-08-17 ENCOUNTER — Ambulatory Visit (HOSPITAL_BASED_OUTPATIENT_CLINIC_OR_DEPARTMENT_OTHER)
Admission: RE | Admit: 2016-08-17 | Discharge: 2016-08-17 | Disposition: A | Discharge status: Home / Self Care | Payer: Medicaid Other | Source: Ambulatory Visit | Attending: Family Medicine | Admitting: Family Medicine

## 2016-08-17 VITALS — BP 120/70 | HR 99 | Temp 98.3°F | Resp 18 | Ht 64.0 in | Wt 140.0 lb

## 2016-08-17 DIAGNOSIS — R1031 Right lower quadrant pain: Secondary | ICD-10-CM

## 2016-08-17 DIAGNOSIS — R109 Unspecified abdominal pain: Secondary | ICD-10-CM

## 2016-08-17 DIAGNOSIS — R319 Hematuria, unspecified: Secondary | ICD-10-CM

## 2016-08-17 DIAGNOSIS — N200 Calculus of kidney: Secondary | ICD-10-CM | POA: Insufficient documentation

## 2016-08-17 DIAGNOSIS — Z87442 Personal history of urinary calculi: Secondary | ICD-10-CM

## 2016-08-17 LAB — POCT URINALYSIS DIP (MANUAL ENTRY)
Bilirubin, UA: NEGATIVE
GLUCOSE UA: NEGATIVE
Leukocytes, UA: NEGATIVE
NITRITE UA: NEGATIVE
PROTEIN UA: NEGATIVE
Spec Grav, UA: >=1.03
Urobilinogen, UA: 0.2
pH, UA: 5.5

## 2016-08-17 LAB — POC MICROSCOPIC URINALYSIS (UMFC)

## 2016-08-17 LAB — POCT CBC
Granulocyte percent: 72.4 %G (ref 37–80)
HCT, POC: 37.5 % — AB (ref 37.7–47.9)
Hemoglobin: 13.3 g/dL (ref 12.2–16.2)
LYMPH, POC: 1.9 (ref 0.6–3.4)
MCH, POC: 30 pg (ref 27–31.2)
MCHC: 35.5 g/dL — AB (ref 31.8–35.4)
MCV: 84.6 fL (ref 80–97)
MID (CBC): 0.2 (ref 0–0.9)
MPV: 8.2 fL (ref 0–99.8)
PLATELET COUNT, POC: 234 10*3/uL (ref 142–424)
POC Granulocyte: 5.7 (ref 2–6.9)
POC LYMPH PERCENT: 24.6 %L (ref 10–50)
POC MID %: 3 %M (ref 0–12)
RBC: 4.43 M/uL (ref 4.04–5.48)
RDW, POC: 13.6 %
WBC: 7.9 10*3/uL (ref 4.6–10.2)

## 2016-08-17 LAB — POCT URINE PREGNANCY: Preg Test, Ur: NEGATIVE

## 2016-08-17 MED ORDER — HYDROCODONE-ACETAMINOPHEN 5-325 MG PO TABS: 1.0000 | ORAL_TABLET | Freq: Four times a day (QID) | ORAL | 0 refills | Status: DC | PRN

## 2016-08-17 MED ORDER — NITROFURANTOIN MONOHYD MACRO 100 MG PO CAPS: 100.0000 mg | ORAL_CAPSULE | Freq: Two times a day (BID) | ORAL | 0 refills | Status: DC

## 2016-08-17 NOTE — Progress Notes (Addendum)
Subjective:    Patient ID: [name] [name], female    DOB: 04-01-1981, 35 y.o.   MRN: [id number] Chief Complaint  Patient presents with  . Abdominal Pain    5 DAYS    HPI [name] [name] is a 35 y.o. female Presents with abdominal pain.  History significant for history of UTI, kidney stones with treatment for possible acute nephrolith in March of this year. recurrent kidney stones in the past, and remote appendectomy when pregnant in past.   Current sx's started last week - more sore about 6 days ago, lessened this past week, but still some discomfort. Lower abd pain, no dysuria, but frequency for past 6 days. No hematuria, but also with R flank pain similar to kidney stone in past. Does not have urologist.   No fever/chills, nausea x 2 but no vomiting. No diarrhea. Normal BM's.   LMP - episodic spotting only - has IUD past 3 years. IUD was in place and ok in March/April when evaluated by gynecologist (with ultrasound).   Tx: tylenol, no other otc meds. Eating and drinking ok.    Patient Active Problem List   Diagnosis Date Noted  . Anemia 09/23/2012  . 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 Headache - frequent 04/21/2012   Past Medical History:  Diagnosis Date  . Abnormal Pap smear 2000   COLPO X2; LAST PAP 2012  . Anemia 09/23/2012  . Fracture of right hand 2012  . Headache(784.0)    FREQUENT  . HPV (human papilloma virus) infection   . Infection 2009   UTI   . Kidney stones 2009   Past Surgical History:  Procedure Laterality Date  . APPENDECTOMY  2001   DURING PREGNANCY  . COLPOSCOPY     No Known Allergies Prior to Admission medications   Medication Sig Start Date End Date Taking? Authorizing Provider  HYDROcodone-acetaminophen (NORCO) 5-325 MG tablet Take 1 tablet by mouth every 4 (four) hours as needed. Patient not taking: Reported on 08/17/2016 02/12/16   Peyton Najjar, MD  tamsulosin (FLOMAX) 0.4 MG CAPS capsule Take 1  capsule (0.4 mg total) by mouth daily. Patient not taking: Reported on 08/17/2016 02/12/16   Peyton Najjar, MD   Social History   Social History  . Marital status: Married    Spouse name: N/A  . Number of children: 2  . Years of education: N/A   Occupational History  . 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 Smoking status: Never Smoker  . Smokeless tobacco: Never Used  . Alcohol use No     Comment: OCCASIONAL MIXED DRINK  . Drug use: No  . Sexual activity: Yes    Partners: Male    Birth control/ protection: None   Other Topics Concern  . Not on file   Social History Narrative  . No narrative on file      Review of Systems  Constitutional: Negative for chills and fever.  Gastrointestinal: Positive for abdominal pain and nausea. Negative for blood in stool, constipation and diarrhea.  Genitourinary: Positive for flank pain, frequency and vaginal bleeding (occasinal spotting (IUD) once per month). Negative for decreased urine volume, dysuria, hematuria and urgency.       Objective:   Physical Exam  Constitutional: She is oriented to person, place, and time. She appears well-developed and well-nourished.  HENT:  Head: Normocephalic and atraumatic.  Eyes: Conjunctivae and EOM  are normal. Pupils are equal, round, and reactive to light.  Neck: Carotid bruit is not present.  Cardiovascular: Normal rate, regular rhythm, normal heart sounds and intact distal pulses.   Pulmonary/Chest: Effort normal and breath sounds normal.  Abdominal: Soft. She exhibits no distension and no pulsatile midline mass. There is tenderness in the right lower quadrant and suprapubic area. There is tenderness at McBurney's point. There is no rigidity, no rebound, no guarding, no CVA tenderness (discomfort R lower flank, but not appreciably TTP over CVA. ) and negative Murphy's sign. No hernia.  Neurological: She is alert and oriented to person, place, and  time.  Skin: Skin is warm and dry.  Psychiatric: She has a normal mood and affect. Her behavior is normal.  Vitals reviewed.  Vitals:   08/17/16 1044  BP: 120/70  Pulse: 99  Resp: 18  Temp: 98.3 F (36.8 C)  TempSrc: Oral  SpO2: 100%  Weight: 140 lb (63.5 kg)  Height: 5\' 4"  (1.626 m)     Results for orders placed or performed in visit on 08/17/16  POCT CBC  Result Value Ref Range   WBC 7.9 4.6 - 10.2 K/uL   Lymph, poc 1.9 0.6 - 3.4   POC LYMPH PERCENT 24.6 10 - 50 %L   MID (cbc) 0.2 0 - 0.9   POC MID % 3.0 0 - 12 %M   POC Granulocyte 5.7 2 - 6.9   Granulocyte percent 72.4 37 - 80 %G   RBC 4.43 4.04 - 5.48 M/uL   Hemoglobin 13.3 12.2 - 16.2 g/dL   HCT, POC 16.1 (A) 09.6 - 47.9 %   MCV 84.6 80 - 97 fL   MCH, POC 30.0 27 - 31.2 pg   MCHC 35.5 (A) 31.8 - 35.4 g/dL   RDW, POC 04.5 %   Platelet Count, POC 234 142 - 424 K/uL   MPV 8.2 0 - 99.8 fL  POCT urine pregnancy  Result Value Ref Range   Preg Test, Ur Negative Negative  POCT urinalysis dipstick  Result Value Ref Range   Color, UA yellow yellow   Clarity, UA clear clear   Glucose, UA negative negative   Bilirubin, UA negative negative   Ketones, POC UA trace (5) (A) negative   Spec Grav, UA >=1.030    Blood, UA large (A) negative   pH, UA 5.5    Protein Ur, POC negative negative   Urobilinogen, UA 0.2    Nitrite, UA Negative Negative   Leukocytes, UA Negative Negative  POCT Microscopic Urinalysis ([facility])  Result Value Ref Range   WBC,UR,HPF,POC None None WBC/hpf   RBC,UR,HPF,POC Moderate (A) None RBC/hpf   Bacteria Few (A) None, Too numerous to count   Mucus Present (A) Absent   Epithelial Cells, UR Per Microscopy Few (A) None, Too numerous to count cells/hpf        Assessment & Plan:   [name] [name] is a 35 y.o. female Abdominal pain, suprapubic, right - Plan: POCT CBC, POCT urine pregnancy, POCT urinalysis dipstick, POCT Microscopic Urinalysis ([facility]), Urine culture, CT RENAL STONE STUDY,  HYDROcodone-acetaminophen (NORCO/VICODIN) 5-325 MG tablet  Right flank pain - Plan: POCT CBC, POCT urine pregnancy, POCT urinalysis dipstick, POCT Microscopic Urinalysis ([facility]), Urine culture, CT RENAL STONE STUDY, nitrofurantoin, macrocrystal-monohydrate, (MACROBID) 100 MG capsule, HYDROcodone-acetaminophen (NORCO/VICODIN) 5-325 MG tablet  History of nephrolithiasis - Plan: POCT urinalysis dipstick, POCT Microscopic Urinalysis ([facility]), Urine culture, CT RENAL STONE STUDY, nitrofurantoin, macrocrystal-monohydrate, (MACROBID) 100 MG capsule  Hematuria -  Plan: Urine culture, CT RENAL STONE STUDY, nitrofurantoin, macrocrystal-monohydrate, (MACROBID) 100 MG capsule  Abdominal pain, right flank pain starting Sunday, worse at that time, but persistent pain and pain on exam. History nephrolithiasis, suspected recurrence of same.   -Hematuria noted on urinalysis, less likely infection, but with persistent frequency, will check urine culture and start Macrobid for now.   -CT renal stone study today, then follow-up with urology likely depending on results.   - Push fluids, filter and container provided to capture stone if passed.  - Tylenol over-the-counter as needed, or hydrocodone if more severe pain. Side effects and RTC precautions were discussed.   CT abdomen pelvis reviewed. Left-sided nonobstructive small stone,that  would likely pass on its own. Possibly has passed right-sided stone, with some residual discomfort. Fluids, recheck early next week and continue antibiotic for now until urine culture returns.   Meds ordered this encounter  Medications  . nitrofurantoin, macrocrystal-monohydrate, (MACROBID) 100 MG capsule    Sig: Take 1 capsule (100 mg total) by mouth 2 (two) times daily.    Dispense:  14 capsule    Refill:  0  . HYDROcodone-acetaminophen (NORCO/VICODIN) 5-325 MG tablet    Sig: Take 1 tablet by mouth every 6 (six) hours as needed for moderate pain.    Dispense:  15 tablet     Refill:  0   Patient Instructions   Based on your continued symptoms, I did order a CT scan to look for kidney stone today. Depending on those results will likely need to meet with a urologist next week. We will call you and let you know.   Tylenol over-the-counter as needed for pain, or if you need something stronger, I did write for a few hydrocodone. Start with 1/2-1 tablet up to every 6 hours as needed. If you have new side effects of that medication, stop it and return to discuss other options. If any worsening of your symptoms, go to emergency room.  There was blood in your urine today, but no definite sign of infection. For now can take the antibiotic until urine culture indicates no infection. If that is the case, we will let you know and you will be able to stop the antibiotic at that time.  Return to the clinic or go to the nearest emergency room if any of your symptoms worsen or new symptoms occur.    Abdominal Pain, Adult Many things can cause abdominal pain. Usually, abdominal pain is not caused by a disease and will improve without treatment. It can often be observed and treated at home. Your health care provider will do a physical exam and possibly order blood tests and X-rays to help determine the seriousness of your pain. However, in many cases, more time must pass before a clear cause of the pain can be found. Before that point, your health care provider may not know if you need more testing or further treatment. HOME CARE INSTRUCTIONS Monitor your abdominal pain for any changes. The following actions may help to alleviate any discomfort you are experiencing:  Only take over-the-counter or prescription medicines as directed by your health care provider.  Do not take laxatives unless directed to do so by your health care provider.  Try a clear liquid diet (broth, tea, or water) as directed by your health care provider. Slowly move to a bland diet as tolerated. SEEK MEDICAL  CARE IF:  You have unexplained abdominal pain.  You have abdominal pain associated with nausea or diarrhea.  You  have pain when you urinate or have a bowel movement.  You experience abdominal pain that wakes you in the night.  You have abdominal pain that is worsened or improved by eating food.  You have abdominal pain that is worsened with eating fatty foods.  You have a fever. SEEK IMMEDIATE MEDICAL CARE IF:  Your pain does not go away within 2 hours.  You keep throwing up (vomiting).  Your pain is felt only in portions of the abdomen, such as the right side or the left lower portion of the abdomen.  You pass bloody or black tarry stools. MAKE SURE YOU:  Understand these instructions.  Will watch your condition.  Will get help right away if you are not doing well or get worse.   This information is not intended to replace advice given to you by your health care provider. Make sure you discuss any questions you have with your health care provider.   Document Released: 08/21/2005 Document Revised: 08/02/2015 Document Reviewed: 07/21/2013 Elsevier Interactive Patient Education 2016 Elsevier Inc.  Hematuria, Adult Hematuria is blood in your urine. It can be caused by a bladder infection, kidney infection, prostate infection, kidney stone, or cancer of your urinary tract. Infections can usually be treated with medicine, and a kidney stone usually will pass through your urine. If neither of these is the cause of your hematuria, further workup to find out the reason may be needed. It is very important that you tell your health care provider about any blood you see in your urine, even if the blood stops without treatment or happens without causing pain. Blood in your urine that happens and then stops and then happens again can be a symptom of a very serious condition. Also, pain is not a symptom in the initial stages of many urinary cancers. HOME CARE INSTRUCTIONS   Drink lots of  fluid, 3-4 quarts a day. If you have been diagnosed with an infection, cranberry juice is especially recommended, in addition to large amounts of water.  Avoid caffeine, tea, and carbonated beverages because they tend to irritate the bladder.  Avoid alcohol because it may irritate the prostate.  Take all medicines as directed by your health care provider.  If you were prescribed an antibiotic medicine, finish it all even if you start to feel better.  If you have been diagnosed with a kidney stone, follow your health care provider's instructions regarding straining your urine to catch the stone.  Empty your bladder often. Avoid holding urine for long periods of time.  After a bowel movement, women should cleanse front to back. Use each tissue only once.  Empty your bladder before and after sexual intercourse if you are a female. SEEK MEDICAL CARE IF:  You develop back pain.  You have a fever.  You have a feeling of sickness in your stomach (nausea) or vomiting.  Your symptoms are not better in 3 days. Return sooner if you are getting worse. SEEK IMMEDIATE MEDICAL CARE IF:   You develop severe vomiting and are unable to keep the medicine down.  You develop severe back or abdominal pain despite taking your medicines.  You begin passing a large amount of blood or clots in your urine.  You feel extremely weak or faint, or you pass out. MAKE SURE YOU:   Understand these instructions.  Will watch your condition.  Will get help right away if you are not doing well or get worse.   This information is not intended  to replace advice given to you by your health care provider. Make sure you discuss any questions you have with your health care provider.   Document Released: 11/11/2005 Document Revised: 12/02/2014 Document Reviewed: 07/12/2013 Elsevier Interactive Patient Education 2016 [employer].     IF you received an x-ray today, you will receive an invoice from [facility]. Please contact [facility] at [phone] with questions or concerns regarding your invoice.   IF you received labwork today, you will receive an invoice from [employer]. Please contact [facility] at [phone] with questions or concerns regarding your invoice.   Our billing staff will not be able to assist you with questions regarding bills from these companies.  You will be contacted with the lab results as soon as they are available. The fastest way to get your results is to activate your My Chart account. Instructions are located on the last page of this paperwork. If you have not heard from [place] regarding the results in 2 weeks, please contact this office.     Signed,   [name], MD [facility].  08/18/16 10:46 PM

## 2016-08-17 NOTE — Telephone Encounter (Signed)
  I called patient to advise of CT results after Dr Neva SeatGreene reviewed it.  IMPRESSION: Nonobstructing left lower pole renal calculus measuring 2 mm. No hydroureteronephrosis.  Faint bilateral hyperattenuation of the renal pyramids may reflect nephrocalcinosis, patient hydration status, although may also be a variant of normal.  Fluids/ small stone should pass / follow up with Dr Neva SeatGreene next week, sooner or ER if worse

## 2016-08-17 NOTE — Patient Instructions (Addendum)
Based on your continued symptoms, I did order a CT scan to look for kidney stone today. Depending on those results will likely need to meet with a urologist next week. We will call you and let you know.   Tylenol over-the-counter as needed for pain, or if you need something stronger, I did write for a few hydrocodone. Start with 1/2-1 tablet up to every 6 hours as needed. If you have new side effects of that medication, stop it and return to discuss other options. If any worsening of your symptoms, go to emergency room.  There was blood in your urine today, but no definite sign of infection. For now can take the antibiotic until urine culture indicates no infection. If that is the case, we will let you know and you will be able to stop the antibiotic at that time.  Return to the clinic or go to the nearest emergency room if any of your symptoms worsen or new symptoms occur.  YOU CAN GO NOW FOR THE CT SCAN, TO [facility] [place] [place]    Driving directions to [id number]  - more info    [address]  [place], [place] [address]     1. Head south on [address] toward [address]      0.7 mi    2. Turn left onto [address]      0.4 mi    3. Take the 3rd right onto [address]      1.1 mi    4. Take the [address] ramp to [facility]      0.2 mi    5. Merge onto [address]      3.7 mi    6. Take [address] for [address] toward [place]      0.3 mi    7. Keep left at the fork, follow signs for [place]      381 ft    8. Keep left at the fork, follow signs for [facility] [address]/[place]      302 ft    9. Turn left onto [address]      2.6 mi    10. Turn right onto [address] will be on the left     0.2 mi     [facility] [place]      Abdominal Pain, Adult Many things can cause abdominal pain. Usually, abdominal pain is not caused by a disease and will improve without treatment. It can often be  observed and treated at home. Your health care provider will do a physical exam and possibly order blood tests and X-rays to help determine the seriousness of your pain. However, in many cases, more time must pass before a clear cause of the pain can be found. Before that point, your health care provider may not know if you need more testing or further treatment. HOME CARE INSTRUCTIONS Monitor your abdominal pain for any changes. The following actions may help to alleviate any discomfort you are experiencing:  Only take over-the-counter or prescription medicines as directed by your health care provider.  Do not take laxatives unless directed to do so by your health care provider.  Try a clear liquid diet (broth, tea, or water) as directed by your health care provider. Slowly move to a bland diet as tolerated. SEEK MEDICAL CARE IF:  You have unexplained abdominal pain.  You  have abdominal pain associated with nausea or diarrhea.  You have pain when you urinate or have a bowel movement.  You experience abdominal pain that wakes you in the night.  You have abdominal pain that is worsened or improved by eating food.  You have abdominal pain that is worsened with eating fatty foods.  You have a fever. SEEK IMMEDIATE MEDICAL CARE IF:  Your pain does not go away within 2 hours.  You keep throwing up (vomiting).  Your pain is felt only in portions of the abdomen, such as the right side or the left lower portion of the abdomen.  You pass bloody or black tarry stools. MAKE SURE YOU:  Understand these instructions.  Will watch your condition.  Will get help right away if you are not doing well or get worse.   This information is not intended to replace advice given to you by your health care provider. Make sure you discuss any questions you have with your health care provider.   Document Released: 08/21/2005 Document Revised: 08/02/2015 Document Reviewed: 07/21/2013 Elsevier  Interactive Patient Education 2016 Elsevier Inc.  Hematuria, Adult Hematuria is blood in your urine. It can be caused by a bladder infection, kidney infection, prostate infection, kidney stone, or cancer of your urinary tract. Infections can usually be treated with medicine, and a kidney stone usually will pass through your urine. If neither of these is the cause of your hematuria, further workup to find out the reason may be needed. It is very important that you tell your health care provider about any blood you see in your urine, even if the blood stops without treatment or happens without causing pain. Blood in your urine that happens and then stops and then happens again can be a symptom of a very serious condition. Also, pain is not a symptom in the initial stages of many urinary cancers. HOME CARE INSTRUCTIONS   Drink lots of fluid, 3-4 quarts a day. If you have been diagnosed with an infection, cranberry juice is especially recommended, in addition to large amounts of water.  Avoid caffeine, tea, and carbonated beverages because they tend to irritate the bladder.  Avoid alcohol because it may irritate the prostate.  Take all medicines as directed by your health care provider.  If you were prescribed an antibiotic medicine, finish it all even if you start to feel better.  If you have been diagnosed with a kidney stone, follow your health care provider's instructions regarding straining your urine to catch the stone.  Empty your bladder often. Avoid holding urine for long periods of time.  After a bowel movement, women should cleanse front to back. Use each tissue only once.  Empty your bladder before and after sexual intercourse if you are a female. SEEK MEDICAL CARE IF:  You develop back pain.  You have a fever.  You have a feeling of sickness in your stomach (nausea) or vomiting.  Your symptoms are not better in 3 days. Return sooner if you are getting worse. SEEK IMMEDIATE  MEDICAL CARE IF:   You develop severe vomiting and are unable to keep the medicine down.  You develop severe back or abdominal pain despite taking your medicines.  You begin passing a large amount of blood or clots in your urine.  You feel extremely weak or faint, or you pass out. MAKE SURE YOU:   Understand these instructions.  Will watch your condition.  Will get help right away if you are not doing  well or get worse.   This information is not intended to replace advice given to you by your health care provider. Make sure you discuss any questions you have with your health care provider.   Document Released: 11/11/2005 Document Revised: 12/02/2014 Document Reviewed: 07/12/2013 Elsevier Interactive Patient Education 2016 [employer]     IF you received an x-ray today, you will receive an invoice from [facility]. Please contact [facility] at [phone] with questions or concerns regarding your invoice.   IF you received labwork today, you will receive an invoice from [facility]. Please contact [facility] at [phone] with questions or concerns regarding your invoice.   Our billing staff will not be able to assist you with questions regarding bills from these companies.  You will be contacted with the lab results as soon as they are available. The fastest way to get your results is to activate your My Chart account. Instructions are located on the last page of this paperwork. If you have not heard from us regarding the results in 2 weeks, please contact this office.

## 2016-08-18 LAB — URINE CULTURE: Organism ID, Bacteria: NO GROWTH

## 2016-08-29 ENCOUNTER — Ambulatory Visit (INDEPENDENT_AMBULATORY_CARE_PROVIDER_SITE_OTHER): Payer: Self-pay | Admitting: Family Medicine

## 2016-08-29 ENCOUNTER — Encounter: Payer: Self-pay | Admitting: Family Medicine

## 2016-08-29 VITALS — BP 104/70 | HR 85 | Temp 98.9°F | Resp 16 | Ht 64.58 in | Wt 137.2 lb

## 2016-08-29 DIAGNOSIS — R1084 Generalized abdominal pain: Secondary | ICD-10-CM

## 2016-08-29 DIAGNOSIS — Z8619 Personal history of other infectious and parasitic diseases: Secondary | ICD-10-CM

## 2016-08-29 DIAGNOSIS — Z87442 Personal history of urinary calculi: Secondary | ICD-10-CM

## 2016-08-29 DIAGNOSIS — R3129 Other microscopic hematuria: Secondary | ICD-10-CM

## 2016-08-29 DIAGNOSIS — M545 Low back pain, unspecified: Secondary | ICD-10-CM

## 2016-08-29 DIAGNOSIS — R112 Nausea with vomiting, unspecified: Secondary | ICD-10-CM

## 2016-08-29 LAB — POCT URINALYSIS DIP (MANUAL ENTRY)
BILIRUBIN UA: NEGATIVE
BILIRUBIN UA: NEGATIVE
GLUCOSE UA: NEGATIVE
NITRITE UA: NEGATIVE
PH UA: 5
Protein Ur, POC: NEGATIVE
SPEC GRAV UA: 1.02
Urobilinogen, UA: 0.2

## 2016-08-29 LAB — POCT CBC
GRANULOCYTE PERCENT: 76.2 % (ref 37–80)
HCT, POC: 39.7 % (ref 37.7–47.9)
HEMOGLOBIN: 13.9 g/dL (ref 12.2–16.2)
Lymph, poc: 2.2 (ref 0.6–3.4)
MCH: 30.1 pg (ref 27–31.2)
MCHC: 35 g/dL (ref 31.8–35.4)
MCV: 85.8 fL (ref 80–97)
MID (cbc): 0.5 (ref 0–0.9)
MPV: 7.9 fL (ref 0–99.8)
PLATELET COUNT, POC: 253 10*3/uL (ref 142–424)
POC Granulocyte: 8.5 — AB (ref 2–6.9)
POC LYMPH PERCENT: 19.5 %L (ref 10–50)
POC MID %: 4.3 % (ref 0–12)
RBC: 4.63 M/uL (ref 4.04–5.48)
RDW, POC: 13.2 %
WBC: 11.1 10*3/uL — AB (ref 4.6–10.2)

## 2016-08-29 LAB — POC MICROSCOPIC URINALYSIS (UMFC): Mucus: ABSENT

## 2016-08-29 MED ORDER — OMEPRAZOLE 20 MG PO CPDR: 20.0000 mg | DELAYED_RELEASE_CAPSULE | Freq: Every day | ORAL | 1 refills | Status: DC

## 2016-08-29 NOTE — Patient Instructions (Addendum)
Bland foods, soup, liquids most important tonight. Start omeprazole one pill once per day.  Follow-up with me tomorrow morning to recheck your blood count and to determine if CT scanning is needed again.  I will refer you to a nephrologist for the persistent blood in the urine and history of kidney stones. I also checked a urine culture to look for infection, but this is less likely a urinary tract infection.  Your low back pain is likely due to muscle pain. You can try heat or ice to that area, Tylenol as needed. See other information below.  For the ongoing pelvic pain, follow-up with your OB/GYN to determine if other testing or treatment is needed.   Abdominal Pain, Adult Many things can cause abdominal pain. Usually, abdominal pain is not caused by a disease and will improve without treatment. It can often be observed and treated at home. Your health care provider will do a physical exam and possibly order blood tests and X-rays to help determine the seriousness of your pain. However, in many cases, more time must pass before a clear cause of the pain can be found. Before that point, your health care provider may not know if you need more testing or further treatment. HOME CARE INSTRUCTIONS Monitor your abdominal pain for any changes. The following actions may help to alleviate any discomfort you are experiencing:  Only take over-the-counter or prescription medicines as directed by your health care provider.  Do not take laxatives unless directed to do so by your health care provider.  Try a clear liquid diet (broth, tea, or water) as directed by your health care provider. Slowly move to a bland diet as tolerated. SEEK MEDICAL CARE IF:  You have unexplained abdominal pain.  You have abdominal pain associated with nausea or diarrhea.  You have pain when you urinate or have a bowel movement.  You experience abdominal pain that wakes you in the night.  You have abdominal pain that is  worsened or improved by eating food.  You have abdominal pain that is worsened with eating fatty foods.  You have a fever. SEEK IMMEDIATE MEDICAL CARE IF:  Your pain does not go away within 2 hours.  You keep throwing up (vomiting).  Your pain is felt only in portions of the abdomen, such as the right side or the left lower portion of the abdomen.  You pass bloody or black tarry stools. MAKE SURE YOU:  Understand these instructions.  Will watch your condition.  Will get help right away if you are not doing well or get worse.   This information is not intended to replace advice given to you by your health care provider. Make sure you discuss any questions you have with your health care provider.   Document Released: 08/21/2005 Document Revised: 08/02/2015 Document Reviewed: 07/21/2013 Elsevier Interactive Patient Education 2016 [employer]  Hematuria, Adult Hematuria is blood in your urine. It can be caused by a bladder infection, kidney infection, prostate infection, kidney stone, or cancer of your urinary tract. Infections can usually be treated with medicine, and a kidney stone usually will pass through your urine. If neither of these is the cause of your hematuria, further workup to find out the reason may be needed. It is very important that you tell your health care provider about any blood you see in your urine, even if the blood stops without treatment or happens without causing pain. Blood in your urine that happens and then stops and then  happens again can be a symptom of a very serious condition. Also, pain is not a symptom in the initial stages of many urinary cancers. HOME CARE INSTRUCTIONS   Drink lots of fluid, 3-4 quarts a day. If you have been diagnosed with an infection, cranberry juice is especially recommended, in addition to large amounts of water.  Avoid caffeine, tea, and carbonated beverages because they tend to irritate the bladder.  Avoid alcohol  because it may irritate the prostate.  Take all medicines as directed by your health care provider.  If you were prescribed an antibiotic medicine, finish it all even if you start to feel better.  If you have been diagnosed with a kidney stone, follow your health care provider's instructions regarding straining your urine to catch the stone.  Empty your bladder often. Avoid holding urine for long periods of time.  After a bowel movement, women should cleanse front to back. Use each tissue only once.  Empty your bladder before and after sexual intercourse if you are a female. SEEK MEDICAL CARE IF:  You develop back pain.  You have a fever.  You have a feeling of sickness in your stomach (nausea) or vomiting.  Your symptoms are not better in 3 days. Return sooner if you are getting worse. SEEK IMMEDIATE MEDICAL CARE IF:   You develop severe vomiting and are unable to keep the medicine down.  You develop severe back or abdominal pain despite taking your medicines.  You begin passing a large amount of blood or clots in your urine.  You feel extremely weak or faint, or you pass out. MAKE SURE YOU:   Understand these instructions.  Will watch your condition.  Will get help right away if you are not doing well or get worse.   This information is not intended to replace advice given to you by your health care provider. Make sure you discuss any questions you have with your health care provider.   Document Released: 11/11/2005 Document Revised: 12/02/2014 Document Reviewed: 07/12/2013 Elsevier Interactive Patient Education 2016 [employer]   Back Pain, Adult Back pain is very common in adults.The cause of back pain is rarely dangerous and the pain often gets better over time.The cause of your back pain may not be known. Some common causes of back pain include:  Strain of the muscles or ligaments supporting the spine.  Wear and tear (degeneration) of the spinal  disks.  Arthritis.  Direct injury to the back. For many people, back pain may return. Since back pain is rarely dangerous, most people can learn to manage this condition on their own. HOME CARE INSTRUCTIONS Watch your back pain for any changes. The following actions may help to lessen any discomfort you are feeling:  Remain active. It is stressful on your back to sit or stand in one place for long periods of time. Do not sit, drive, or stand in one place for more than 30 minutes at a time. Take short walks on even surfaces as soon as you are able.Try to increase the length of time you walk each day.  Exercise regularly as directed by your health care provider. Exercise helps your back heal faster. It also helps avoid future injury by keeping your muscles strong and flexible.  Do not stay in bed.Resting more than 1-2 days can delay your recovery.  Pay attention to your body when you bend and lift. The most comfortable positions are those that put less stress on your recovering back. Always  use proper lifting techniques, including:  Bending your knees.  Keeping the load close to your body.  Avoiding twisting.  Find a comfortable position to sleep. Use a firm mattress and lie on your side with your knees slightly bent. If you lie on your back, put a pillow under your knees.  Avoid feeling anxious or stressed.Stress increases muscle tension and can worsen back pain.It is important to recognize when you are anxious or stressed and learn ways to manage it, such as with exercise.  Take medicines only as directed by your health care provider. Over-the-counter medicines to reduce pain and inflammation are often the most helpful.Your health care provider may prescribe muscle relaxant drugs.These medicines help dull your pain so you can more quickly return to your normal activities and healthy exercise.  Apply ice to the injured area:  Put ice in a plastic bag.  Place a towel between your  skin and the bag.  Leave the ice on for 20 minutes, 2-3 times a day for the first 2-3 days. After that, ice and heat may be alternated to reduce pain and spasms.  Maintain a healthy weight. Excess weight puts extra stress on your back and makes it difficult to maintain good posture. SEEK MEDICAL CARE IF:  You have pain that is not relieved with rest or medicine.  You have increasing pain going down into the legs or buttocks.  You have pain that does not improve in one week.  You have night pain.  You lose weight.  You have a fever or chills. SEEK IMMEDIATE MEDICAL CARE IF:   You develop new bowel or bladder control problems.  You have unusual weakness or numbness in your arms or legs.  You develop nausea or vomiting.  You develop abdominal pain.  You feel faint.   This information is not intended to replace advice given to you by your health care provider. Make sure you discuss any questions you have with your health care provider.   Document Released: 11/11/2005 Document Revised: 12/02/2014 Document Reviewed: 03/15/2014 Elsevier Interactive Patient Education 2016 [employer].   IF you received an x-ray today, you will receive an invoice from [facility]. Please contact [facility] at [phone] with questions or concerns regarding your invoice.   IF you received labwork today, you will receive an invoice from [employer]. Please contact [facility] at [phone] with questions or concerns regarding your invoice.   Our billing staff will not be able to assist you with questions regarding bills from these companies.  You will be contacted with the lab results as soon as they are available. The fastest way to get your results is to activate your My Chart account. Instructions are located on the last page of this paperwork. If you have not heard from [place] regarding the results in 2 weeks, please contact this office.

## 2016-08-29 NOTE — Progress Notes (Signed)
Subjective:  By signing my name below, I, [name], attest that this documentation has been prepared under the direction and in the presence of [name], MD. Electronically Signed: [name], Scribe. 08/29/2016 , 5:40 PM .  Patient was seen in Room 23 .   Patient ID: [name] [name], female    DOB: [birth year], 35 y.o.   MRN: [id number] Chief Complaint  Patient presents with  . Follow-up    KIDNEY STONES  . Abdominal Pain    per patient in the middle of abdomen - on/off with vomiting once   HPI [name] [name] is a 35 y.o. female  Here for follow up of abdominal pain, last seen by me on Sept 23rd. At that time, she had abdominal pain for approximately 1 week with associated urinary frequency. She had history of kidney stones and was experiencing right flank pain with similar symptoms to prior kidney stones. She had microscopic hematuria at that visit, but negative urine culture. CT renal stone study on Sept 23rd had non obstructing left sided renal calculus measuring 2mm and faint bilateral hyperattenuation of the renal pyramids. Advised to increase fluid intake and follow up with me in 1 week. Her CBC was normal at last visit.   Patient has been taking hydrocodone half pill qd prn. She reports that her right sided pain is still present intermittently and some pain with movement.   She complains waking up to terrible upper abdominal pain early yesterday morning. This occurs intermittently depending on what she eats. She had coffee for breakfast the day before and enchiladas for dinner the day before. She had 1 episode of vomiting and diarrhea. She took half tablet of hydrocodone for relief. She's feeling improvement today. She denies blood in vomit.   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 She has an IUD in place. She denies pelvic pain, vaginal discharge, vaginal bleeding, dysuria or hematuria. She does mentions having some lower  abdominal and pelvic pain. She informs that she's always had that, and is usually sore on exam at her OBGYN. She has h/o abnormal pap smears and was checked every 6 months for a while; but most recently, they have normal.   Patient Active Problem List   Diagnosis Date Noted  . Anemia 09/23/2012  . 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 Headache - frequent 04/21/2012   Past Medical History:  Diagnosis Date  . Abnormal Pap smear 2000   COLPO X2; LAST PAP 2012  . Anemia 09/23/2012  . Fracture of right hand 2012  . Headache(784.0)    FREQUENT  . HPV (human papilloma virus) infection   . Infection 2009   UTI   . Kidney stones 2009   Past Surgical History:  Procedure Laterality Date  . APPENDECTOMY  2001   DURING PREGNANCY  . COLPOSCOPY     No Known Allergies Prior to Admission medications   Medication Sig Start Date End Date Taking? Authorizing Provider  HYDROcodone-acetaminophen (NORCO/VICODIN) 5-325 MG tablet Take 1 tablet by mouth every 6 (six) hours as needed for moderate pain. 08/17/16  Yes Shade Flood, MD  nitrofurantoin, macrocrystal-monohydrate, (MACROBID) 100 MG capsule Take 1 capsule (100 mg total) by mouth 2 (two) times daily. Patient not taking: Reported on 08/29/2016 08/17/16   Shade Flood, MD   Social History   Social History  . Marital status: Married    Spouse name: N/A  . Number of children: 2  .  Years of education: N/A   Occupational History  . [employer]ORY [name]    WILL D/C 04/17/12   Social History Main Topics  . Smoking status: Never Smoker  . Smokeless tobacco: Never Used  . Alcohol use No     Comment: OCCASIONAL MIXED DRINK  . Drug use: No  . Sexual activity: Yes    Partners: Male    Birth control/ protection: None   Other Topics Concern  . Not on file   Social History Narrative  . No narrative on file   Review of Systems  Gastrointestinal: Positive for abdominal pain, diarrhea and vomiting. Negative for  blood in stool.  Genitourinary: Positive for dyspareunia and flank pain. Negative for dysuria, frequency, hematuria, pelvic pain, vaginal bleeding and vaginal discharge.  Musculoskeletal: Positive for back pain and myalgias.       Objective:   Physical Exam  Constitutional: She is oriented to person, place, and time. She appears well-developed and well-nourished. No distress.  HENT:  Head: Normocephalic and atraumatic.  Eyes: EOM are normal. Pupils are equal, round, and reactive to light.  Neck: Neck supple.  Cardiovascular: Normal rate.   Pulmonary/Chest: Effort normal. No respiratory distress.  Abdominal:  Diffusely tender along epigastrium and suprapubic, diffuse tenderness with heel-jar  Musculoskeletal: Normal range of motion.  Negative seated straight leg raise, no midline bony tenderness in her back, tender along paraspinals of lumbar spine  Neurological: She is alert and oriented to person, place, and time.  Skin: Skin is warm and dry.  Psychiatric: She has a normal mood and affect. Her behavior is normal.  Nursing note and vitals reviewed.  Vitals:   08/29/16 1712  BP: 96/66  Pulse: 85  Resp: 16  Temp: 98.9 F (37.2 C)  TempSrc: Oral  SpO2: 98%  Weight: 137 lb 3.2 oz (62.2 kg)  Height: 5' 4.58" (1.64 m)   Results for orders placed or performed in visit on 08/29/16  POCT CBC  Result Value Ref Range   WBC 11.1 (A) 4.6 - 10.2 K/uL   Lymph, poc 2.2 0.6 - 3.4   POC LYMPH PERCENT 19.5 10 - 50 %L   MID (cbc) 0.5 0 - 0.9   POC MID % 4.3 0 - 12 %M   POC Granulocyte 8.5 (A) 2 - 6.9   Granulocyte percent 76.2 37 - 80 %G   RBC 4.63 4.04 - 5.48 M/uL   Hemoglobin 13.9 12.2 - 16.2 g/dL   HCT, POC 16.139.7 09.637.7 - 47.9 %   MCV 85.8 80 - 97 fL   MCH, POC 30.1 27 - 31.2 pg   MCHC 35.0 31.8 - 35.4 g/dL   RDW, POC 04.513.2 %   Platelet Count, POC 253 142 - 424 K/uL   MPV 7.9 0 - 99.8 fL  POCT urinalysis dipstick  Result Value Ref Range   Color, UA yellow yellow   Clarity, UA  clear clear   Glucose, UA negative negative   Bilirubin, UA negative negative   Ketones, POC UA negative negative   Spec Grav, UA 1.020    Blood, UA large (A) negative   pH, UA 5.0    Protein Ur, POC negative negative   Urobilinogen, UA 0.2    Nitrite, UA Negative Negative   Leukocytes, UA Trace (A) Negative  POCT Microscopic Urinalysis ([facility])  Result Value Ref Range   WBC,UR,HPF,POC None None WBC/hpf   RBC,UR,HPF,POC Few (A) None RBC/hpf   Bacteria Few (A) None, Too numerous to count  Mucus Absent Absent   Epithelial Cells, UR Per Microscopy Few (A) None, Too numerous to count cells/hpf       Assessment & Plan:    Harleyquinn Gasser is a 34 y.o. female Bilateral low back pain without sciatica, unspecified chronicity  - suspected muscular pain. Tylenol, range of motion, stretches. RTC precautions. Does not appear to be due to nephrolith or true CVA tenderness.  Generalized abdominal pain - Plan: POCT CBC, COMPLETE METABOLIC PANEL WITH GFR, POCT urinalysis dipstick, POCT Microscopic Urinalysis ([facility]), Lipase, Urine culture, omeprazole (PRILOSEC) 20 MG capsule Non-intractable vomiting with nausea, unspecified vomiting type - Plan: POCT urinalysis dipstick, POCT Microscopic Urinalysis ([facility]), Lipase History of Helicobacter pylori infection - Plan: omeprazole (PRILOSEC) 20 MG capsule  - Intermittent, recurrent abdominal pain. Epigastric. History of H. pylori infection that was treated. Deferred repeat IgG as this may remain positive.  -Bland diet, avoid spicy foods fried foods for now.   -Differential also includes gallbladder, will check CMP,  - check lipase with episode of vomiting, but doubt pancreatitis. Restart PPI, omeprazole one per day and follow-up planned in 1 day as borderline leukocytosis.  History of nephrolithiasis - Plan: POCT urinalysis dipstick, POCT Microscopic Urinalysis ([facility]), Ambulatory referral to Urology Other microscopic hematuria - Plan: Urine culture,  Ambulatory referral to Urology  - History of nephrolithiasis, persistent microscopic hematuria. Previous CT urogram few weeks ago showed small left-sided stone, but at that time was having right-sided symptoms.   -Refer to urology, RTC precautions if acute nephrolith symptoms.  Meds ordered this encounter  Medications  . omeprazole (PRILOSEC) 20 MG capsule    Sig: Take 1 capsule (20 mg total) by mouth daily.    Dispense:  30 capsule    Refill:  1   Patient Instructions    Bland foods, soup, liquids most important tonight. Start omeprazole one pill once per day.  Follow-up with me tomorrow morning to recheck your blood count and to determine if CT scanning is needed again.  I will refer you to a nephrologist for the persistent blood in the urine and history of kidney stones. I also checked a urine culture to look for infection, but this is less likely a urinary tract infection.  Your low back pain is likely due to muscle pain. You can try heat or ice to that area, Tylenol as needed. See other information below.  For the ongoing pelvic pain, follow-up with your OB/GYN to determine if other testing or treatment is needed.   Abdominal Pain, Adult Many things can cause abdominal pain. Usually, abdominal pain is not caused by a disease and will improve without treatment. It can often be observed and treated at home. Your health care provider will do a physical exam and possibly order blood tests and X-rays to help determine the seriousness of your pain. However, in many cases, more time must pass before a clear cause of the pain can be found. Before that point, your health care provider may not know if you need more testing or further treatment. HOME CARE INSTRUCTIONS Monitor your abdominal pain for any changes. The following actions may help to alleviate any discomfort you are experiencing:  Only take over-the-counter or prescription medicines as directed by your health care provider.  Do  not take laxatives unless directed to do so by your health care provider.  Try a clear liquid diet (broth, tea, or water) as directed by your health care provider. Slowly move to a bland diet as tolerated. SEEK  MEDICAL CARE IF:  You have unexplained abdominal pain.  You have abdominal pain associated with nausea or diarrhea.  You have pain when you urinate or have a bowel movement.  You experience abdominal pain that wakes you in the night.  You have abdominal pain that is worsened or improved by eating food.  You have abdominal pain that is worsened with eating fatty foods.  You have a fever. SEEK IMMEDIATE MEDICAL CARE IF:  Your pain does not go away within 2 hours.  You keep throwing up (vomiting).  Your pain is felt only in portions of the abdomen, such as the right side or the left lower portion of the abdomen.  You pass bloody or black tarry stools. MAKE SURE YOU:  Understand these instructions.  Will watch your condition.  Will get help right away if you are not doing well or get worse.   This information is not intended to replace advice given to you by your health care provider. Make sure you discuss any questions you have with your health care provider.   Document Released: 08/21/2005 Document Revised: 08/02/2015 Document Reviewed: 07/21/2013 Elsevier Interactive Patient Education 2016 Elsevier Inc.  Hematuria, Adult Hematuria is blood in your urine. It can be caused by a bladder infection, kidney infection, prostate infection, kidney stone, or cancer of your urinary tract. Infections can usually be treated with medicine, and a kidney stone usually will pass through your urine. If neither of these is the cause of your hematuria, further workup to find out the reason may be needed. It is very important that you tell your health care provider about any blood you see in your urine, even if the blood stops without treatment or happens without causing pain. Blood in  your urine that happens and then stops and then happens again can be a symptom of a very serious condition. Also, pain is not a symptom in the initial stages of many urinary cancers. HOME CARE INSTRUCTIONS   Drink lots of fluid, 3-4 quarts a day. If you have been diagnosed with an infection, cranberry juice is especially recommended, in addition to large amounts of water.  Avoid caffeine, tea, and carbonated beverages because they tend to irritate the bladder.  Avoid alcohol because it may irritate the prostate.  Take all medicines as directed by your health care provider.  If you were prescribed an antibiotic medicine, finish it all even if you start to feel better.  If you have been diagnosed with a kidney stone, follow your health care provider's instructions regarding straining your urine to catch the stone.  Empty your bladder often. Avoid holding urine for long periods of time.  After a bowel movement, women should cleanse front to back. Use each tissue only once.  Empty your bladder before and after sexual intercourse if you are a female. SEEK MEDICAL CARE IF:  You develop back pain.  You have a fever.  You have a feeling of sickness in your stomach (nausea) or vomiting.  Your symptoms are not better in 3 days. Return sooner if you are getting worse. SEEK IMMEDIATE MEDICAL CARE IF:   You develop severe vomiting and are unable to keep the medicine down.  You develop severe back or abdominal pain despite taking your medicines.  You begin passing a large amount of blood or clots in your urine.  You feel extremely weak or faint, or you pass out. MAKE SURE YOU:   Understand these instructions.  Will watch your condition.  Will get help right away if you are not doing well or get worse.   This information is not intended to replace advice given to you by your health care provider. Make sure you discuss any questions you have with your health care provider.   Document  Released: 11/11/2005 Document Revised: 12/02/2014 Document Reviewed: 07/12/2013 Elsevier Interactive Patient Education 2016 Elsevier Inc.   Back Pain, Adult Back pain is very common in adults.The cause of back pain is rarely dangerous and the pain often gets better over time.The cause of your back pain may not be known. Some common causes of back pain include:  Strain of the muscles or ligaments supporting the spine.  Wear and tear (degeneration) of the spinal disks.  Arthritis.  Direct injury to the back. For many people, back pain may return. Since back pain is rarely dangerous, most people can learn to manage this condition on their own. HOME CARE INSTRUCTIONS Watch your back pain for any changes. The following actions may help to lessen any discomfort you are feeling:  Remain active. It is stressful on your back to sit or stand in one place for long periods of time. Do not sit, drive, or stand in one place for more than 30 minutes at a time. Take short walks on even surfaces as soon as you are able.Try to increase the length of time you walk each day.  Exercise regularly as directed by your health care provider. Exercise helps your back heal faster. It also helps avoid future injury by keeping your muscles strong and flexible.  Do not stay in bed.Resting more than 1-2 days can delay your recovery.  Pay attention to your body when you bend and lift. The most comfortable positions are those that put less stress on your recovering back. Always use proper lifting techniques, including:  Bending your knees.  Keeping the load close to your body.  Avoiding twisting.  Find a comfortable position to sleep. Use a firm mattress and lie on your side with your knees slightly bent. If you lie on your back, put a pillow under your knees.  Avoid feeling anxious or stressed.Stress increases muscle tension and can worsen back pain.It is important to recognize when you are anxious or  stressed and learn ways to manage it, such as with exercise.  Take medicines only as directed by your health care provider. Over-the-counter medicines to reduce pain and inflammation are often the most helpful.Your health care provider may prescribe muscle relaxant drugs.These medicines help dull your pain so you can more quickly return to your normal activities and healthy exercise.  Apply ice to the injured area:  Put ice in a plastic bag.  Place a towel between your skin and the bag.  Leave the ice on for 20 minutes, 2-3 times a day for the first 2-3 days. After that, ice and heat may be alternated to reduce pain and spasms.  Maintain a healthy weight. Excess weight puts extra stress on your back and makes it difficult to maintain good posture. SEEK MEDICAL CARE IF:  You have pain that is not relieved with rest or medicine.  You have increasing pain going down into the legs or buttocks.  You have pain that does not improve in one week.  You have night pain.  You lose weight.  You have a fever or chills. SEEK IMMEDIATE MEDICAL CARE IF:   You develop new bowel or bladder control problems.  You have unusual weakness or numbness in your  arms or legs.  You develop nausea or vomiting.  You develop abdominal pain.  You feel faint.   This information is not intended to replace advice given to you by your health care provider. Make sure you discuss any questions you have with your health care provider.   Document Released: 11/11/2005 Document Revised: 12/02/2014 Document Reviewed: 03/15/2014 Elsevier Interactive Patient Education 2016 [employer].   IF you received an x-ray today, you will receive an invoice from [facility]. Please contact [facility] at [phone] with questions or concerns regarding your invoice.   IF you received labwork today, you will receive an invoice from [employer]. Please contact [facility] at  [phone] with questions or concerns regarding your invoice.   Our billing staff will not be able to assist you with questions regarding bills from these companies.  You will be contacted with the lab results as soon as they are available. The fastest way to get your results is to activate your My Chart account. Instructions are located on the last page of this paperwork. If you have not heard from [place] regarding the results in 2 weeks, please contact this office.       I personally performed the services described in this documentation, which was scribed in my presence. The recorded information has been reviewed and considered, and addended by me as needed.   Signed,   [name], MD [facility].  09/01/16 12:51 PM

## 2016-08-30 ENCOUNTER — Telehealth: Payer: Self-pay

## 2016-08-30 ENCOUNTER — Ambulatory Visit (INDEPENDENT_AMBULATORY_CARE_PROVIDER_SITE_OTHER): Payer: Self-pay | Admitting: Family Medicine

## 2016-08-30 VITALS — BP 112/76 | HR 78 | Temp 98.0°F | Resp 16 | Ht 64.0 in | Wt 138.0 lb

## 2016-08-30 DIAGNOSIS — R1013 Epigastric pain: Secondary | ICD-10-CM

## 2016-08-30 DIAGNOSIS — M25559 Pain in unspecified hip: Secondary | ICD-10-CM

## 2016-08-30 DIAGNOSIS — R319 Hematuria, unspecified: Secondary | ICD-10-CM

## 2016-08-30 DIAGNOSIS — R1011 Right upper quadrant pain: Secondary | ICD-10-CM

## 2016-08-30 LAB — COMPLETE METABOLIC PANEL WITH GFR
ALK PHOS: 74 U/L (ref 33–115)
ALT: 11 U/L (ref 6–29)
AST: 13 U/L (ref 10–30)
Albumin: 4.2 g/dL (ref 3.6–5.1)
BUN: 8 mg/dL (ref 7–25)
CHLORIDE: 102 mmol/L (ref 98–110)
CO2: 28 mmol/L (ref 20–31)
Calcium: 9.4 mg/dL (ref 8.6–10.2)
Creat: 0.78 mg/dL (ref 0.50–1.10)
GFR, Est African American: >89 mL/min (ref >=60)
GLUCOSE: 92 mg/dL (ref 65–99)
POTASSIUM: 4.2 mmol/L (ref 3.5–5.3)
SODIUM: 138 mmol/L (ref 135–146)
Total Bilirubin: 0.5 mg/dL (ref 0.2–1.2)
Total Protein: 6.9 g/dL (ref 6.1–8.1)

## 2016-08-30 LAB — POCT CBC
GRANULOCYTE PERCENT: 73.9 % (ref 37–80)
HCT, POC: 39.8 % (ref 37.7–47.9)
HEMOGLOBIN: 13.7 g/dL (ref 12.2–16.2)
Lymph, poc: 1.7 (ref 0.6–3.4)
MCH: 29.7 pg (ref 27–31.2)
MCHC: 34.4 g/dL (ref 31.8–35.4)
MCV: 86.4 fL (ref 80–97)
MID (cbc): 0.7 (ref 0–0.9)
MPV: 7.9 fL (ref 0–99.8)
PLATELET COUNT, POC: 236 10*3/uL (ref 142–424)
POC Granulocyte: 4.6 (ref 2–6.9)
POC LYMPH PERCENT: 18.4 %L (ref 10–50)
POC MID %: 7.7 %M (ref 0–12)
RBC: 4.6 M/uL (ref 4.04–5.48)
RDW, POC: 13.1 %
WBC: 9.2 10*3/uL (ref 4.6–10.2)

## 2016-08-30 LAB — LIPASE: LIPASE: 5 U/L — AB (ref 7–60)

## 2016-08-30 LAB — POCT URINE PREGNANCY: PREG TEST UR: NEGATIVE

## 2016-08-30 NOTE — Progress Notes (Signed)
Subjective:  By signing my name below, I, [name] [name], attest that this documentation has been prepared under the direction and in the presence of [name] Staggers, MD. Electronically Signed: Stann [name], Scribe. 08/30/2016 , 10:59 AM .  Patient was seen in Room 2 .   Patient ID: [name] [name], female    DOB: 05/19/1981, 35 y.o.   MRN: [id number] Chief Complaint  Patient presents with  . Follow-up    labwork   HPI [name] [name] is a 35 y.o. female Here for follow-up of abdominal pain. See office visit yesterday. H/o nephrolithiasis with persistent hematuria. Referred to urology, but has had intermittent recurrent abdominal pain with worse symptoms 2 days ago. Afebrile yesterday but borderline wbc, previous vomiting and diarrhea have improved. Lipase and CMP are pending, as well as urine culture. She has had some recurrent pelvic discomfort that has been evaluated by OBGYN and IUD in place for contraception. She's monogamous with her spouse; denies history of STI's.   Patient states her abdomen is slightly improved and less sore. She hasn't started omeprazole but was informed it is ready for pick up this morning. She was able to drink juice and eat cereal last night. She denies eating anything this morning yet. She denies fever, vomiting or diarrhea.   Her last appointment with OBGYN was in April. Her lower abdominal pains have been chronic.   Patient Active Problem List   Diagnosis Date Noted  . Anemia 09/23/2012  . 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 Headache - frequent 04/21/2012   Past Medical History:  Diagnosis Date  . Abnormal Pap smear 2000   COLPO X2; LAST PAP 2012  . Anemia 09/23/2012  . Fracture of right hand 2012  . Headache(784.0)    FREQUENT  . HPV (human papilloma virus) infection   . Infection 2009   UTI   . Kidney stones 2009   Past Surgical History:  Procedure Laterality Date  . APPENDECTOMY  2001   DURING PREGNANCY    . COLPOSCOPY     No Known Allergies Prior to Admission medications   Medication Sig Start Date End Date Taking? Authorizing Provider  HYDROcodone-acetaminophen (NORCO/VICODIN) 5-325 MG tablet Take 1 tablet by mouth every 6 (six) hours as needed for moderate pain. 08/17/16  Yes Shade Flood, MD  omeprazole (PRILOSEC) 20 MG capsule Take 1 capsule (20 mg total) by mouth daily. 08/29/16  Yes Shade Flood, MD   Social History   Social History  . Marital status: Married    Spouse name: N/A  . Number of children: 2  . Years of education: N/A   Occupational History  . 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 Smoking status: Never Smoker  . Smokeless tobacco: Never Used  . Alcohol use No     Comment: OCCASIONAL MIXED DRINK  . Drug use: No  . Sexual activity: Yes    Partners: Male    Birth control/ protection: None   Other Topics Concern  . Not on file   Social History Narrative  . No narrative on file   Review of Systems  Constitutional: Negative for chills, fatigue, fever and unexpected weight change.  Respiratory: Negative for cough.   Gastrointestinal: Positive for abdominal pain. Negative for constipation, diarrhea, nausea and vomiting.  Skin: Negative for rash and wound.  Neurological: Negative for dizziness, weakness and headaches.  Objective:   Physical Exam  Constitutional: She is oriented to person, place, and time. She appears well-developed and well-nourished. No distress.  HENT:  Head: Normocephalic and atraumatic.  Eyes: EOM are normal. Pupils are equal, round, and reactive to light.  Neck: Neck supple.  Cardiovascular: Normal rate.   Pulmonary/Chest: Effort normal. No respiratory distress.  Abdominal: There is tenderness in the right upper quadrant, right lower quadrant, epigastric area and left lower quadrant. There is positive Murphy's sign. There is no CVA tenderness.  Most tender over epigastric and  RUQ, slight tenderness over RLQ and LLQ  Musculoskeletal: Normal range of motion.  Neurological: She is alert and oriented to person, place, and time.  Skin: Skin is warm and dry.  Psychiatric: She has a normal mood and affect. Her behavior is normal.  Nursing note and vitals reviewed.   Vitals:   08/30/16 0949  BP: 112/76  Pulse: 78  Resp: 16  Temp: 98 F (36.7 C)  TempSrc: Oral  SpO2: 100%  Weight: 138 lb (62.6 kg)  Height: 5\' 4"  (1.626 m)   Results for orders placed or performed in visit on 08/30/16  POCT CBC  Result Value Ref Range   WBC 9.2 4.6 - 10.2 K/uL   Lymph, poc 1.7 0.6 - 3.4   POC LYMPH PERCENT 18.4 10 - 50 %L   MID (cbc) 0.7 0 - 0.9   POC MID % 7.7 0 - 12 %M   POC Granulocyte 4.60 2 - 6.9   Granulocyte percent 73.9 37 - 80 %G   RBC 4.60 4.04 - 5.48 M/uL   Hemoglobin 13.7 12.2 - 16.2 g/dL   HCT, POC 16.1 09.6 - 47.9 %   MCV 86.4 80 - 97 fL   MCH, POC 29.7 27 - 31.2 pg   MCHC 34.4 31.8 - 35.4 g/dL   RDW, POC 04.5 %   Platelet Count, POC 236 142 - 424 K/uL   MPV 7.9 0 - 99.8 fL  POCT urine pregnancy  Result Value Ref Range   Preg Test, Ur Negative Negative       Assessment & Plan:     [name] is a 35 y.o. female RUQ abdominal pain Abdominal pain, epigastric - Plan: POCT CBC, POCT urine pregnancy, US Abdomen Complete, US Pelvis Complete  - CBC improved, afebrile. Gastritis versus less likely peptic ulcer disease possible. Advised to start omeprazole as planned.  - Differential also includes gallbladder source, with some guarding on Murphy testing. Will check abdominal pelvic ultrasound as both upper and lower abdominal pain. Continue bland diet, avoid fried/fatty foods, PPI daily, and RTC precautions were discussed if any fevers or worsening abdominal pain.  Hematuria, unspecified type - Plan: POCT urine pregnancy, US Abdomen Complete, US Pelvis Complete  - Referral placed last visit to urology.  Pain in joint involving pelvic region  and thigh, unspecified laterality - Plan: US Abdomen Complete, US Pelvis Complete  -Long-standing pelvic discomfort. May be related to IUD. We'll refer to health department to consider removal of IUD and start Implanon or other form of contraception to see if this helps her discomfort. rtc precautions if worsening.   No orders of the defined types were placed in this encounter.  Patient Instructions   For abdominal pain, the liver and pancreas tests are still pending. Your blood count is improved, but I still would like to check an ultrasound to look at your gallbladder and other abdominal and pelvic structures because your pain has  been recurrent. We will call and schedule that hopefully in the next 2 days. Continue omeprazole 1 pill once per day, avoid spicy foods, fried foods, and work on a bland diet until your symptoms have improved.   We are trying to schedule you an appointment at the health department to look into having the IUD removed and information on Nexplanon is provided below. To determine eligibility you will be need to go to the Department of Social Services [address], [place], [place].  Return to the clinic or go to the nearest emergency room if any of your symptoms worsen or new symptoms occur.   Abdominal Pain, Adult Many things can cause abdominal pain. Usually, abdominal pain is not caused by a disease and will improve without treatment. It can often be observed and treated at home. Your health care provider will do a physical exam and possibly order blood tests and X-rays to help determine the seriousness of your pain. However, in many cases, more time must pass before a clear cause of the pain can be found. Before that point, your health care provider may not know if you need more testing or further treatment. HOME CARE INSTRUCTIONS Monitor your abdominal pain for any changes. The following actions may help to alleviate any discomfort you are experiencing:  Only take  over-the-counter or prescription medicines as directed by your health care provider.  Do not take laxatives unless directed to do so by your health care provider.  Try a clear liquid diet (broth, tea, or water) as directed by your health care provider. Slowly move to a bland diet as tolerated. SEEK MEDICAL CARE IF:  You have unexplained abdominal pain.  You have abdominal pain associated with nausea or diarrhea.  You have pain when you urinate or have a bowel movement.  You experience abdominal pain that wakes you in the night.  You have abdominal pain that is worsened or improved by eating food.  You have abdominal pain that is worsened with eating fatty foods.  You have a fever. SEEK IMMEDIATE MEDICAL CARE IF:  Your pain does not go away within 2 hours.  You keep throwing up (vomiting).  Your pain is felt only in portions of the abdomen, such as the right side or the left lower portion of the abdomen.  You pass bloody or black tarry stools. MAKE SURE YOU:  Understand these instructions.  Will watch your condition.  Will get help right away if you are not doing well or get worse.   This information is not intended to replace advice given to you by your health care provider. Make sure you discuss any questions you have with your health care provider.   Document Released: 08/21/2005 Document Revised: 08/02/2015 Document Reviewed: 07/21/2013 Elsevier Interactive Patient Education 2016 [employer].     IF you received an x-ray today, you will receive an invoice from [facility]. Please contact [facility] at [phone] with questions or concerns regarding your invoice.   IF you received labwork today, you will receive an invoice from [employer]. Please contact [facility] at [phone] with questions or concerns regarding your invoice.   Our billing staff will not be able to assist you with questions regarding bills from  these companies.  You will be contacted with the lab results as soon as they are available. The fastest way to get your results is to activate your My Chart account. Instructions are located on the last page of this paperwork. If  you have not heard from [place] regarding the results in 2 weeks, please contact this office.     Etonogestrel implant What is this medicine? ETONOGESTREL (et oh noe JES trel) is a contraceptive (birth control) device. It is used to prevent pregnancy. It can be used for up to 3 years. This medicine may be used for other purposes; ask your health care provider or pharmacist if you have questions. What should I tell my health care provider before I take this medicine? They need to know if you have any of these conditions: -abnormal vaginal bleeding -blood vessel disease or blood clots -cancer of the breast, cervix, or liver -depression -diabetes -gallbladder disease -headaches -heart disease or recent heart attack -high blood pressure -high cholesterol -kidney disease -liver disease -renal disease -seizures -tobacco smoker -an unusual or allergic reaction to etonogestrel, other hormones, anesthetics or antiseptics, medicines, foods, dyes, or preservatives -pregnant or trying to get pregnant -breast-feeding How should I use this medicine? This device is inserted just under the skin on the inner side of your upper arm by a health care professional. Talk to your pediatrician regarding the use of this medicine in children. Special care may be needed. Overdosage: If you think you have taken too much of this medicine contact a poison control center or emergency room at once. NOTE: This medicine is only for you. Do not share this medicine with others. What if I miss a dose? This does not apply. What may interact with this medicine? Do not take this medicine with any of the following medications: -amprenavir -bosentan -fosamprenavir This medicine may also interact  with the following medications: -barbiturate medicines for inducing sleep or treating seizures -certain medicines for fungal infections like ketoconazole and itraconazole -griseofulvin -medicines to treat seizures like carbamazepine, felbamate, oxcarbazepine, phenytoin, topiramate -modafinil -phenylbutazone -rifampin -some medicines to treat HIV infection like atazanavir, indinavir, lopinavir, nelfinavir, tipranavir, ritonavir -St. John's wort This list may not describe all possible interactions. Give your health care provider a list of all the medicines, herbs, non-prescription drugs, or dietary supplements you use. Also tell them if you smoke, drink alcohol, or use illegal drugs. Some items may interact with your medicine. What should I watch for while using this medicine? This product does not protect you against HIV infection (AIDS) or other sexually transmitted diseases. You should be able to feel the implant by pressing your fingertips over the skin where it was inserted. Contact your doctor if you cannot feel the implant, and use a non-hormonal birth control method (such as condoms) until your doctor confirms that the implant is in place. If you feel that the implant may have broken or become bent while in your arm, contact your healthcare provider. What side effects may I notice from receiving this medicine? Side effects that you should report to your doctor or health care professional as soon as possible: -allergic reactions like skin rash, itching or hives, swelling of the face, lips, or tongue -breast lumps -changes in emotions or moods -depressed mood -heavy or prolonged menstrual bleeding -pain, irritation, swelling, or bruising at the insertion site -scar at site of insertion -signs of infection at the insertion site such as fever, and skin redness, pain or discharge -signs of pregnancy -signs and symptoms of a blood clot such as breathing problems; changes in vision; chest  pain; severe, sudden headache; pain, swelling, warmth in the leg; trouble speaking; sudden numbness or weakness of the face, arm or leg -signs and symptoms of liver injury like  dark yellow or brown urine; general ill feeling or flu-like symptoms; light-colored stools; loss of appetite; nausea; right upper belly pain; unusually weak or tired; yellowing of the eyes or skin -unusual vaginal bleeding, discharge -signs and symptoms of a stroke like changes in vision; confusion; trouble speaking or understanding; severe headaches; sudden numbness or weakness of the face, arm or leg; trouble walking; dizziness; loss of balance or coordination Side effects that usually do not require medical attention (Report these to your doctor or health care professional if they continue or are bothersome.): -acne -back pain -breast pain -changes in weight -dizziness -general ill feeling or flu-like symptoms -headache -irregular menstrual bleeding -nausea -sore throat -vaginal irritation or inflammation This list may not describe all possible side effects. Call your doctor for medical advice about side effects. You may report side effects to FDA at 1-800-FDA-1088. Where should I keep my medicine? This drug is given in a hospital or clinic and will not be stored at home. NOTE: This sheet is a summary. It may not cover all possible information. If you have questions about this medicine, talk to your doctor, pharmacist, or health care provider.    2016, Elsevier/Gold Standard. (2014-08-26 14:07:06)   Pelvic Pain, Female Female pelvic pain can be caused by many different things and start from a variety of places. Pelvic pain refers to pain that is located in the lower half of the abdomen and between your hips. The pain may occur over a short period of time (acute) or may be reoccurring (chronic). The cause of pelvic pain may be related to disorders affecting the female reproductive organs (gynecologic), but it may  also be related to the bladder, kidney stones, an intestinal complication, or muscle or skeletal problems. Getting help right away for pelvic pain is important, especially if there has been severe, sharp, or a sudden onset of unusual pain. It is also important to get help right away because some types of pelvic pain can be life threatening.  CAUSES  Below are only some of the causes of pelvic pain. The causes of pelvic pain can be in one of several categories.   Gynecologic.  Pelvic inflammatory disease.  Sexually transmitted infection.  Ovarian cyst or a twisted ovarian ligament (ovarian torsion).  Uterine lining that grows outside the uterus (endometriosis).  Fibroids, cysts, or tumors.  Ovulation.  Pregnancy.  Pregnancy that occurs outside the uterus (ectopic pregnancy).  Miscarriage.  Labor.  Abruption of the placenta or ruptured uterus.  Infection.  Uterine infection (endometritis).  Bladder infection.  Diverticulitis.  Miscarriage related to a uterine infection (septic abortion).  Bladder.  Inflammation of the bladder (cystitis).  Kidney stone(s).  Gastrointestinal.  Constipation.  Diverticulitis.  Neurologic.  Trauma.  Feeling pelvic pain because of mental or emotional causes (psychosomatic).  Cancers of the bowel or pelvis. EVALUATION  Your caregiver will want to take a careful history of your concerns. This includes recent changes in your health, a careful gynecologic history of your periods (menses), and a sexual history. Obtaining your family history and medical history is also important. Your caregiver may suggest a pelvic exam. A pelvic exam will help identify the location and severity of the pain. It also helps in the evaluation of which organ system may be involved. In order to identify the cause of the pelvic pain and be properly treated, your caregiver may order tests. These tests may include:   A pregnancy test.  Pelvic  ultrasonography.  An X-ray exam of the abdomen.  A urinalysis or evaluation of vaginal discharge.  Blood tests. HOME CARE INSTRUCTIONS   Only take over-the-counter or prescription medicines for pain, discomfort, or fever as directed by your caregiver.   Rest as directed by your caregiver.   Eat a balanced diet.   Drink enough fluids to make your urine clear or pale yellow, or as directed.   Avoid sexual intercourse if it causes pain.   Apply warm or cold compresses to the lower abdomen depending on which one helps the pain.   Avoid stressful situations.   Keep a journal of your pelvic pain. Write down when it started, where the pain is located, and if there are things that seem to be associated with the pain, such as food or your menstrual cycle.  Follow up with your caregiver as directed.  SEEK MEDICAL CARE IF:  Your medicine does not help your pain.  You have abnormal vaginal discharge. SEEK IMMEDIATE MEDICAL CARE IF:   You have heavy bleeding from the vagina.   Your pelvic pain increases.   You feel light-headed or faint.   You have chills.   You have pain with urination or blood in your urine.   You have uncontrolled diarrhea or vomiting.   You have a fever or persistent symptoms for more than 3 days.  You have a fever and your symptoms suddenly get worse.   You are being physically or sexually abused.   This information is not intended to replace advice given to you by your health care provider. Make sure you discuss any questions you have with your health care provider.   Document Released: 10/08/2004 Document Revised: 08/02/2015 Document Reviewed: 03/02/2012 Elsevier Interactive Patient Education [employer]    I personally performed the services described in this documentation, which was scribed in my presence. The recorded information has been reviewed and considered, and addended by me as needed.   Signed,   [name] Staggers, MD [facility].  09/01/16 1:01 PM

## 2016-08-30 NOTE — Telephone Encounter (Signed)
Needs to call [facility] to schedule ultasounds. I tried to call and was on hold for 20 min. Left pt a VM stating that she can call to schedule this weekend if she wants; if not I will try to call again on Monday for her.   [facility]: [phone]. She just needs to tell them that she wants to schedule an ordered ultasound

## 2016-08-30 NOTE — Progress Notes (Signed)
   Patient reports increased pain with sex since IUD has been in place. She would like information regarding Nexplanon and is open to having the IUD removed.  Pelvic Exam Only Normal female external genitalia without lesion. No inguinal lymphadenopathy. Vaginal mucosa is pink and moist without lesions. Cervix  without discharge, not friable.  No cervical motion tenderness. Tenderness present with deep palpation of external pelvic region immediately below symphysis pubis. IUD string visible extending from cervix.  Godfrey PickKimberly S. Tiburcio PeaHarris, MSN, FNP-C [facility]

## 2016-08-30 NOTE — Patient Instructions (Addendum)
For abdominal pain, the liver and pancreas tests are still pending. Your blood count is improved, but I still would like to check an ultrasound to look at your gallbladder and other abdominal and pelvic structures because your pain has been recurrent. We will call and schedule that hopefully in the next 2 days. Continue omeprazole 1 pill once per day, avoid spicy foods, fried foods, and work on a bland diet until your symptoms have improved.   We are trying to schedule you an appointment at the health department to look into having the IUD removed and information on Nexplanon is provided below. To determine eligibility you will be need to go to the Department of Social Services [address] [place], [place].  Return to the clinic or go to the nearest emergency room if any of your symptoms worsen or new symptoms occur.   Abdominal Pain, Adult Many things can cause abdominal pain. Usually, abdominal pain is not caused by a disease and will improve without treatment. It can often be observed and treated at home. Your health care provider will do a physical exam and possibly order blood tests and X-rays to help determine the seriousness of your pain. However, in many cases, more time must pass before a clear cause of the pain can be found. Before that point, your health care provider may not know if you need more testing or further treatment. HOME CARE INSTRUCTIONS Monitor your abdominal pain for any changes. The following actions may help to alleviate any discomfort you are experiencing:  Only take over-the-counter or prescription medicines as directed by your health care provider.  Do not take laxatives unless directed to do so by your health care provider.  Try a clear liquid diet (broth, tea, or water) as directed by your health care provider. Slowly move to a bland diet as tolerated. SEEK MEDICAL CARE IF:  You have unexplained abdominal pain.  You have abdominal pain associated with nausea or  diarrhea.  You have pain when you urinate or have a bowel movement.  You experience abdominal pain that wakes you in the night.  You have abdominal pain that is worsened or improved by eating food.  You have abdominal pain that is worsened with eating fatty foods.  You have a fever. SEEK IMMEDIATE MEDICAL CARE IF:  Your pain does not go away within 2 hours.  You keep throwing up (vomiting).  Your pain is felt only in portions of the abdomen, such as the right side or the left lower portion of the abdomen.  You pass bloody or black tarry stools. MAKE SURE YOU:  Understand these instructions.  Will watch your condition.  Will get help right away if you are not doing well or get worse.   This information is not intended to replace advice given to you by your health care provider. Make sure you discuss any questions you have with your health care provider.   Document Released: 08/21/2005 Document Revised: 08/02/2015 Document Reviewed: 07/21/2013 Elsevier Interactive Patient Education 2016 [employer]     IF you received an x-ray today, you will receive an invoice from [facility]. Please contact [facility] at [phone] with questions or concerns regarding your invoice.   IF you received labwork today, you will receive an invoice from [facility]. Please contact [facility] at [phone] with questions or concerns regarding your invoice.   Our billing staff will not be able to assist you with questions regarding bills from these companies.  You will be contacted with the lab results as soon as they are available. The fastest way to get your results is to activate your My Chart account. Instructions are located on the last page of this paperwork. If you have not heard from [place] regarding the results in 2 weeks, please contact this office.     Etonogestrel implant What is this medicine? ETONOGESTREL (et oh noe JES trel) is a  contraceptive (birth control) device. It is used to prevent pregnancy. It can be used for up to 3 years. This medicine may be used for other purposes; ask your health care provider or pharmacist if you have questions. What should I tell my health care provider before I take this medicine? They need to know if you have any of these conditions: -abnormal vaginal bleeding -blood vessel disease or blood clots -cancer of the breast, cervix, or liver -depression -diabetes -gallbladder disease -headaches -heart disease or recent heart attack -high blood pressure -high cholesterol -kidney disease -liver disease -renal disease -seizures -tobacco smoker -an unusual or allergic reaction to etonogestrel, other hormones, anesthetics or antiseptics, medicines, foods, dyes, or preservatives -pregnant or trying to get pregnant -breast-feeding How should I use this medicine? This device is inserted just under the skin on the inner side of your upper arm by a health care professional. Talk to your pediatrician regarding the use of this medicine in children. Special care may be needed. Overdosage: If you think you have taken too much of this medicine contact a poison control center or emergency room at once. NOTE: This medicine is only for you. Do not share this medicine with others. What if I miss a dose? This does not apply. What may interact with this medicine? Do not take this medicine with any of the following medications: -amprenavir -bosentan -fosamprenavir This medicine may also interact with the following medications: -barbiturate medicines for inducing sleep or treating seizures -certain medicines for fungal infections like ketoconazole and itraconazole -griseofulvin -medicines to treat seizures like carbamazepine, felbamate, oxcarbazepine, phenytoin, topiramate -modafinil -phenylbutazone -rifampin -some medicines to treat HIV infection like atazanavir, indinavir, lopinavir,  nelfinavir, tipranavir, ritonavir -St. John's wort This list may not describe all possible interactions. Give your health care provider a list of all the medicines, herbs, non-prescription drugs, or dietary supplements you use. Also tell them if you smoke, drink alcohol, or use illegal drugs. Some items may interact with your medicine. What should I watch for while using this medicine? This product does not protect you against HIV infection (AIDS) or other sexually transmitted diseases. You should be able to feel the implant by pressing your fingertips over the skin where it was inserted. Contact your doctor if you cannot feel the implant, and use a non-hormonal birth control method (such as condoms) until your doctor confirms that the implant is in place. If you feel that the implant may have broken or become bent while in your arm, contact your healthcare provider. What side effects may I notice from receiving this medicine? Side effects that you should report to your doctor or health care professional as soon as possible: -allergic reactions like skin rash, itching or hives, swelling of the face, lips, or tongue -breast lumps -changes in emotions or moods -depressed mood -heavy or prolonged menstrual bleeding -pain, irritation, swelling, or bruising at the insertion site -scar at site of insertion -signs of infection at the insertion site such as fever, and skin redness, pain or discharge -signs of pregnancy -signs and symptoms of a  blood clot such as breathing problems; changes in vision; chest pain; severe, sudden headache; pain, swelling, warmth in the leg; trouble speaking; sudden numbness or weakness of the face, arm or leg -signs and symptoms of liver injury like dark yellow or brown urine; general ill feeling or flu-like symptoms; light-colored stools; loss of appetite; nausea; right upper belly pain; unusually weak or tired; yellowing of the eyes or skin -unusual vaginal bleeding,  discharge -signs and symptoms of a stroke like changes in vision; confusion; trouble speaking or understanding; severe headaches; sudden numbness or weakness of the face, arm or leg; trouble walking; dizziness; loss of balance or coordination Side effects that usually do not require medical attention (Report these to your doctor or health care professional if they continue or are bothersome.): -acne -back pain -breast pain -changes in weight -dizziness -general ill feeling or flu-like symptoms -headache -irregular menstrual bleeding -nausea -sore throat -vaginal irritation or inflammation This list may not describe all possible side effects. Call your doctor for medical advice about side effects. You may report side effects to FDA at 1-800-FDA-1088. Where should I keep my medicine? This drug is given in a hospital or clinic and will not be stored at home. NOTE: This sheet is a summary. It may not cover all possible information. If you have questions about this medicine, talk to your doctor, pharmacist, or health care provider.    2016, Elsevier/Gold Standard. (2014-08-26 14:07:06)   Pelvic Pain, Female Female pelvic pain can be caused by many different things and start from a variety of places. Pelvic pain refers to pain that is located in the lower half of the abdomen and between your hips. The pain may occur over a short period of time (acute) or may be reoccurring (chronic). The cause of pelvic pain may be related to disorders affecting the female reproductive organs (gynecologic), but it may also be related to the bladder, kidney stones, an intestinal complication, or muscle or skeletal problems. Getting help right away for pelvic pain is important, especially if there has been severe, sharp, or a sudden onset of unusual pain. It is also important to get help right away because some types of pelvic pain can be life threatening.  CAUSES  Below are only some of the causes of pelvic pain.  The causes of pelvic pain can be in one of several categories.   Gynecologic.  Pelvic inflammatory disease.  Sexually transmitted infection.  Ovarian cyst or a twisted ovarian ligament (ovarian torsion).  Uterine lining that grows outside the uterus (endometriosis).  Fibroids, cysts, or tumors.  Ovulation.  Pregnancy.  Pregnancy that occurs outside the uterus (ectopic pregnancy).  Miscarriage.  Labor.  Abruption of the placenta or ruptured uterus.  Infection.  Uterine infection (endometritis).  Bladder infection.  Diverticulitis.  Miscarriage related to a uterine infection (septic abortion).  Bladder.  Inflammation of the bladder (cystitis).  Kidney stone(s).  Gastrointestinal.  Constipation.  Diverticulitis.  Neurologic.  Trauma.  Feeling pelvic pain because of mental or emotional causes (psychosomatic).  Cancers of the bowel or pelvis. EVALUATION  Your caregiver will want to take a careful history of your concerns. This includes recent changes in your health, a careful gynecologic history of your periods (menses), and a sexual history. Obtaining your family history and medical history is also important. Your caregiver may suggest a pelvic exam. A pelvic exam will help identify the location and severity of the pain. It also helps in the evaluation of which organ system may be  involved. In order to identify the cause of the pelvic pain and be properly treated, your caregiver may order tests. These tests may include:   A pregnancy test.  Pelvic ultrasonography.  An X-ray exam of the abdomen.  A urinalysis or evaluation of vaginal discharge.  Blood tests. HOME CARE INSTRUCTIONS   Only take over-the-counter or prescription medicines for pain, discomfort, or fever as directed by your caregiver.   Rest as directed by your caregiver.   Eat a balanced diet.   Drink enough fluids to make your urine clear or pale yellow, or as directed.   Avoid  sexual intercourse if it causes pain.   Apply warm or cold compresses to the lower abdomen depending on which one helps the pain.   Avoid stressful situations.   Keep a journal of your pelvic pain. Write down when it started, where the pain is located, and if there are things that seem to be associated with the pain, such as food or your menstrual cycle.  Follow up with your caregiver as directed.  SEEK MEDICAL CARE IF:  Your medicine does not help your pain.  You have abnormal vaginal discharge. SEEK IMMEDIATE MEDICAL CARE IF:   You have heavy bleeding from the vagina.   Your pelvic pain increases.   You feel light-headed or faint.   You have chills.   You have pain with urination or blood in your urine.   You have uncontrolled diarrhea or vomiting.   You have a fever or persistent symptoms for more than 3 days.  You have a fever and your symptoms suddenly get worse.   You are being physically or sexually abused.   This information is not intended to replace advice given to you by your health care provider. Make sure you discuss any questions you have with your health care provider.   Document Released: 10/08/2004 Document Revised: 08/02/2015 Document Reviewed: 03/02/2012 Elsevier Interactive Patient Education [employer]

## 2016-08-31 LAB — URINE CULTURE: Organism ID, Bacteria: NO GROWTH

## 2016-09-02 ENCOUNTER — Other Ambulatory Visit: Payer: Self-pay

## 2016-09-02 DIAGNOSIS — R1011 Right upper quadrant pain: Secondary | ICD-10-CM

## 2016-09-05 ENCOUNTER — Ambulatory Visit (HOSPITAL_COMMUNITY): Payer: Medicaid Other

## 2016-09-10 ENCOUNTER — Ambulatory Visit (HOSPITAL_COMMUNITY)
Admission: RE | Admit: 2016-09-10 | Discharge: 2016-09-10 | Disposition: A | Discharge status: Home / Self Care | Payer: Self-pay | Source: Ambulatory Visit | Attending: Family Medicine | Admitting: Family Medicine

## 2016-09-10 DIAGNOSIS — R319 Hematuria, unspecified: Secondary | ICD-10-CM

## 2016-09-10 DIAGNOSIS — M25559 Pain in unspecified hip: Secondary | ICD-10-CM | POA: Insufficient documentation

## 2016-09-10 DIAGNOSIS — R1013 Epigastric pain: Secondary | ICD-10-CM

## 2016-09-10 DIAGNOSIS — R1011 Right upper quadrant pain: Secondary | ICD-10-CM

## 2016-09-10 DIAGNOSIS — Z975 Presence of (intrauterine) contraceptive device: Secondary | ICD-10-CM | POA: Insufficient documentation

## 2017-07-08 ENCOUNTER — Encounter: Payer: Self-pay | Admitting: Physician Assistant

## 2017-07-08 ENCOUNTER — Ambulatory Visit (INDEPENDENT_AMBULATORY_CARE_PROVIDER_SITE_OTHER): Payer: Self-pay | Admitting: Physician Assistant

## 2017-07-08 VITALS — BP 110/66 | HR 80 | Temp 97.9°F | Resp 16 | Ht 64.25 in | Wt 139.6 lb

## 2017-07-08 DIAGNOSIS — N949 Unspecified condition associated with female genital organs and menstrual cycle: Secondary | ICD-10-CM

## 2017-07-08 DIAGNOSIS — R102 Pelvic and perineal pain: Secondary | ICD-10-CM

## 2017-07-08 LAB — POCT CBC
GRANULOCYTE PERCENT: 78.6 % (ref 37–80)
HEMATOCRIT: 42 % (ref 37.7–47.9)
Hemoglobin: 14.1 g/dL (ref 12.2–16.2)
Lymph, poc: 2.4 (ref 0.6–3.4)
MCH: 29.3 pg (ref 27–31.2)
MCHC: 33.5 g/dL (ref 31.8–35.4)
MCV: 87.5 fL (ref 80–97)
MID (CBC): 0.9 (ref 0–0.9)
MPV: 8.5 fL (ref 0–99.8)
PLATELET COUNT, POC: 258 10*3/uL (ref 142–424)
POC GRANULOCYTE: 12.1 — AB (ref 2–6.9)
POC LYMPH PERCENT: 15.7 %L (ref 10–50)
POC MID %: 5.7 %M (ref 0–12)
RBC: 4.81 M/uL (ref 4.04–5.48)
RDW, POC: 13.6 %
WBC: 15.4 10*3/uL — AB (ref 4.6–10.2)

## 2017-07-08 LAB — POCT WET + KOH PREP
Trich by wet prep: ABSENT
YEAST BY WET PREP: ABSENT
Yeast by KOH: ABSENT

## 2017-07-08 LAB — POCT URINALYSIS DIP (MANUAL ENTRY)
Bilirubin, UA: NEGATIVE
GLUCOSE UA: NEGATIVE mg/dL
Ketones, POC UA: NEGATIVE mg/dL
Leukocytes, UA: NEGATIVE
Nitrite, UA: NEGATIVE
Protein Ur, POC: NEGATIVE mg/dL
SPEC GRAV UA: 1.025 (ref 1.010–1.025)
UROBILINOGEN UA: 0.2 U/dL
pH, UA: 6 (ref 5.0–8.0)

## 2017-07-08 LAB — POCT URINE PREGNANCY: PREG TEST UR: NEGATIVE

## 2017-07-08 LAB — POC MICROSCOPIC URINALYSIS (UMFC): Mucus: ABSENT

## 2017-07-08 MED ORDER — DOXYCYCLINE HYCLATE 100 MG PO CAPS: 100.0000 mg | ORAL_CAPSULE | Freq: Two times a day (BID) | ORAL | 0 refills | Status: AC

## 2017-07-08 MED ORDER — CEFTRIAXONE SODIUM 250 MG IJ SOLR
250.0000 mg | Freq: Once | INTRAMUSCULAR | Status: AC
  Administered 2017-07-08: 250 mg via INTRAMUSCULAR

## 2017-07-08 NOTE — Progress Notes (Signed)
[name] [name]  MRN: [id number] DOB: [birth year]  Subjective:   [name] [name] is a 36 y.o. female who presents for evaluation of abdominal pain. Onset was 4 days ago. Symptoms have been gradually improving. The pain is described as cramping. Pain is located in the suprapubic region without radiation.  Aggravating factors: pressure.  Alleviating factors: resting. Associated symptoms: dysuria and urinary fequency. The patient denies low back pain, flank pain, anorexia, arthralagias, belching, chills, constipation, diarrhea, fever, hematochezia, hematuria, melena, myalgias, nausea, sweats, and vomiting. Has had IUD for 3 years. Has not had a cycle since she had the IUD placed. She is sexually active with monogamous. She was seen last year in 08/2016 for the same pain and had [place] of abdomen, tranvaginal, and pelvis, which were normal. She was treated with norco and the pain resolve. Follows a Child psychotherapist at [facility], Dr. Pennie Rushing. Notes she had a pap last year and it was normal. PSH includes appendectomy.   Review of Systems  Genitourinary: Positive for dyspareunia (baseline ). Negative for genital sores, vaginal bleeding, vaginal discharge and vaginal pain.    Patient Active Problem List   Diagnosis Date Noted  . Anemia 09/23/2012  . 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 Headache - frequent 04/21/2012    Current Outpatient Prescriptions on File Prior to Visit  Medication Sig Dispense Refill  . HYDROcodone-acetaminophen (NORCO/VICODIN) 5-325 MG tablet Take 1 tablet by mouth every 6 (six) hours as needed for moderate pain. 15 tablet 0  . omeprazole (PRILOSEC) 20 MG capsule Take 1 capsule (20 mg total) by mouth daily. (Patient not taking: Reported on 07/08/2017) 30 capsule 1   No current facility-administered medications on file prior to visit.     No Known Allergies   Objective:  BP 110/66 (BP Location: Left Arm, Cuff Size: Normal)   Pulse 80   Temp  97.9 F (36.6 C) (Oral)   Resp 16   Ht 5' 4.25" (1.632 m)   Wt 139 lb 9.6 oz (63.3 kg)   SpO2 98%   BMI 23.78 kg/m   Physical Exam  Constitutional: She is oriented to person, place, and time and well-developed, well-nourished, and in no distress.  HENT:  Head: Normocephalic and atraumatic.  Eyes: Conjunctivae are normal.  Neck: Normal range of motion.  Pulmonary/Chest: Effort normal.  Abdominal: Soft. Normal appearance and bowel sounds are normal. She exhibits no distension. There is tenderness in the suprapubic area. There is no rigidity, no guarding, no CVA tenderness and negative Murphy's sign.  Genitourinary: Uterus normal, right adnexa normal, left adnexa normal and vulva normal. Uterus is not enlarged and not tender. Cervix exhibits motion tenderness (Mild tenderness at cervix with bimanual exam.). Thick  white and vaginal discharge found.  Genitourinary Comments: IUD strings intact.    Neurological: She is alert and oriented to person, place, and time. Gait normal.  Skin: Skin is warm and dry.  Psychiatric: Affect normal.  Vitals reviewed.    Results for orders placed or performed in visit on 07/08/17 (from the past 24 hour(s))  POCT urinalysis dipstick     Status: Abnormal   Collection Time: 07/08/17 10:41 AM  Result Value Ref Range   Color, UA yellow yellow   Clarity, UA clear clear   Glucose, UA negative negative mg/dL   Bilirubin, UA negative negative   Ketones, POC UA negative negative mg/dL   Spec Grav, UA 4.098 1.191 - 1.025   Blood, UA large (A) negative  pH, UA 6.0 5.0 - 8.0   Protein Ur, POC negative negative mg/dL   Urobilinogen, UA 0.2 0.2 or 1.0 E.U./dL   Nitrite, UA Negative Negative   Leukocytes, UA Negative Negative  POCT Microscopic Urinalysis ([facility])     Status: Abnormal   Collection Time: 07/08/17 10:41 AM  Result Value Ref Range   WBC,UR,HPF,POC Moderate (A) None WBC/hpf   RBC,UR,HPF,POC Few (A) None RBC/hpf   Bacteria Few (A) None, Too  numerous to count   Mucus Absent Absent   Epithelial Cells, UR Per Microscopy Few (A) None, Too numerous to count cells/hpf  POCT CBC     Status: Abnormal   Collection Time: 07/08/17 10:41 AM  Result Value Ref Range   WBC 15.4 (A) 4.6 - 10.2 K/uL   Lymph, poc 2.4 0.6 - 3.4   POC LYMPH PERCENT 15.7 10 - 50 %L   MID (cbc) 0.9 0 - 0.9   POC MID % 5.7 0 - 12 %M   POC Granulocyte 12.1 (A) 2 - 6.9   Granulocyte percent 78.6 37 - 80 %G   RBC 4.81 4.04 - 5.48 M/uL   Hemoglobin 14.1 12.2 - 16.2 g/dL   HCT, POC 09.842.0 11.937.7 - 47.9 %   MCV 87.5 80 - 97 fL   MCH, POC 29.3 27 - 31.2 pg   MCHC 33.5 31.8 - 35.4 g/dL   RDW, POC 14.713.6 %   Platelet Count, POC 258 142 - 424 K/uL   MPV 8.5 0 - 99.8 fL  POCT Wet + KOH Prep     Status: Abnormal   Collection Time: 07/08/17 10:41 AM  Result Value Ref Range   Yeast by KOH Absent Absent   Yeast by wet prep Absent Absent   WBC by wet prep Moderate (A) Few   Clue Cells Wet Prep HPF POC Few (A) None   Trich by wet prep Absent Absent   Bacteria Wet Prep HPF POC Moderate (A) Few   Epithelial Cells By [employer]et Pref ([facility]) None None, Few, Too numerous to count   RBC,UR,HPF,POC None None RBC/hpf  POCT urine pregnancy     Status: None   Collection Time: 07/08/17 10:58 AM  Result Value Ref Range   Preg Test, Ur Negative Negative     Assessment and Plan :  This case was precepted with Dr. Leretha PolSantiago.   1. Suprapubic pain - POCT urinalysis dipstick - POCT Microscopic Urinalysis ([facility]) - POCT CBC - POCT Wet + KOH Prep - GC/Chlamydia Probe Amp - POCT urine pregnancy - Urine Culture - HIV antibody - RPR - Care order/instruction: 2. CMT (cervical motion tenderness) Concern for PID due to hx, PE findings, and elevated WBC. Pt appears stable, in no acute distress. Vitals are stable. Will treat empirically for PID at this time. Labs pending. Pt encouraged to return in 2 days for reevaluation. Given strict ED precautions both verbally and in AVS. If no improvement in  2 days, consider imaging.  - cefTRIAXone (ROCEPHIN) injection 250 mg; Inject 250 mg into the muscle once. - doxycycline (VIBRAMYCIN) 100 MG capsule; Take 1 capsule (100 mg total) by mouth 2 (two) times daily.  Dispense: 28 capsule; Refill: 0   Benjiman CoreBrittany Liel Rudden, PA-C  [facility] at [facility] 07/08/2017 1:52 PM

## 2017-07-08 NOTE — Patient Instructions (Addendum)
We are going to treat you for PID at this time. I would like you to take antibiotic as prescribed. Please wear sunscreen while on this medication as it increases risk for sunburn. We have obtained other labs today which will be results in about a week. I would like you to follow up in office in 2 days for reevaluation of your symptoms to make sure you are improving. If you develop any nausea, vomiting, fever, worsening abdominal pain, or chills before your follow up appointment please seek care immediately at the ED.    Pelvic Inflammatory Disease Pelvic inflammatory disease (PID) is an infection in some or all of the female organs. PID can be in the uterus, ovaries, fallopian tubes, or the surrounding tissues that are inside the lower belly area (pelvis). PID can lead to lasting problems if it is not treated. To check for this disease, your doctor may:  Do a physical exam.  Do blood tests, urine tests, or a pregnancy test.  Look at your vaginal discharge.  Do tests to look inside the pelvis.  Test you for other infections.  Follow these instructions at home:  Take over-the-counter and prescription medicines only as told by your doctor.  If you were prescribed an antibiotic medicine, take it as told by your doctor. Do not stop taking it even if you start to feel better.  Do not have sex until treatment is done or as told by your doctor.  Tell your sex partner if you have PID. Your partner may need to be treated.  Keep all follow-up visits as told by your doctor. This is important.  Your doctor may test you for infection again 3 months after you are treated. Contact a doctor if:  You have more fluid (discharge) coming from your vagina or fluid that is not normal.  Your pain does not improve.  You throw up (vomit).  You have a fever.  You cannot take your medicines.  Your partner has a sexually transmitted disease (STD).  You have pain when you pee (urinate). Get help right  away if:  You have more belly (abdominal) or lower belly pain.  You have chills.  You are not better after 72 hours. This information is not intended to replace advice given to you by your health care provider. Make sure you discuss any questions you have with your health care provider. Document Released: 02/07/2009 Document Revised: 04/18/2016 Document Reviewed: 12/19/2014 Elsevier Interactive Patient Education  2018 [employer]     IF you received an x-ray today, you will receive an invoice from [facility]. Please contact [facility] at [phone] with questions or concerns regarding your invoice.   IF you received labwork today, you will receive an invoice from [facility]. Please contact [facility] at [phone] with questions or concerns regarding your invoice.   Our billing staff will not be able to assist you with questions regarding bills from these companies.  You will be contacted with the lab results as soon as they are available. The fastest way to get your results is to activate your My Chart account. Instructions are located on the last page of this paperwork. If you have not heard from us regarding the results in 2 weeks, please contact this office.

## 2017-07-09 LAB — GC/CHLAMYDIA PROBE AMP
Chlamydia trachomatis, NAA: NEGATIVE
NEISSERIA GONORRHOEAE BY PCR: NEGATIVE

## 2017-07-09 LAB — HIV ANTIBODY (ROUTINE TESTING W REFLEX): HIV Screen 4th Generation wRfx: NONREACTIVE

## 2017-07-09 LAB — RPR: RPR: NONREACTIVE

## 2017-07-09 LAB — URINE CULTURE: ORGANISM ID, BACTERIA: NO GROWTH

## 2017-07-10 ENCOUNTER — Encounter: Payer: Self-pay | Admitting: Family Medicine

## 2017-07-10 ENCOUNTER — Ambulatory Visit (INDEPENDENT_AMBULATORY_CARE_PROVIDER_SITE_OTHER): Payer: Self-pay | Admitting: Family Medicine

## 2017-07-10 VITALS — BP 115/73 | HR 83 | Temp 98.4°F | Resp 16 | Ht 64.25 in | Wt 140.0 lb

## 2017-07-10 DIAGNOSIS — N730 Acute parametritis and pelvic cellulitis: Secondary | ICD-10-CM

## 2017-07-10 NOTE — Progress Notes (Signed)
8/16/20185:41 PM  [name] [name] [birth year], 36 y.o. female [id number]  Chief Complaint  Patient presents with  . Follow-up    Abdominal pain    HPI:   Patient is a 36 y.o. female who presents today for fu on suprapubic pain treated empirically as PID. Patient tolerating doxycycline well, pain improving. Denies nausea, vomiting, fever or chills. Has no acute concerns today.   Depression screen [facility] 2/9 07/10/2017 07/08/2017 08/30/2016  Decreased Interest 0 0 0  Down, Depressed, Hopeless 0 0 0  PHQ - 2 Score 0 0 0    No Known Allergies  Current Outpatient Prescriptions on File Prior to Visit  Medication Sig Dispense Refill  . doxycycline (VIBRAMYCIN) 100 MG capsule Take 1 capsule (100 mg total) by mouth 2 (two) times daily. 28 capsule 0  . HYDROcodone-acetaminophen (NORCO/VICODIN) 5-325 MG tablet Take 1 tablet by mouth every 6 (six) hours as needed for moderate pain. (Patient not taking: Reported on 07/10/2017) 15 tablet 0  . omeprazole (PRILOSEC) 20 MG capsule Take 1 capsule (20 mg total) by mouth daily. (Patient not taking: Reported on 07/08/2017) 30 capsule 1   No current facility-administered medications on file prior to visit.     Past Medical History:  Diagnosis Date  . Abnormal Pap smear 2000   COLPO X2; LAST PAP 2012  . Anemia 09/23/2012  . Fracture of right hand 2012  . Headache(784.0)    FREQUENT  . HPV (human papilloma virus) infection   . Infection 2009   UTI   . Kidney stones 2009    Past Surgical History:  Procedure Laterality Date  . APPENDECTOMY  2001   DURING PREGNANCY  . COLPOSCOPY      Social History  Substance Use Topics  . Smoking status: Never Smoker  . Smokeless tobacco: Never Used  . Alcohol use No     Comment: OCCASIONAL MIXED DRINK    Family History  Problem Relation Age of Onset  . COPD Paternal Grandfather   . Cancer Father   . Depression Son 9       AFTER DEATH OF FATHER  . Anesthesia problems Neg Hx   . Other Neg  Hx     Review of Systems  Constitutional: Negative for chills and fever.  Gastrointestinal: Negative for nausea and vomiting.     OBJECTIVE:  Vitals:   07/10/17 1722  Weight: 140 lb (63.5 kg)  Height: 5' 4.25" (1.632 m)    Physical Exam  Constitutional: She is oriented to person, place, and time and well-developed, well-nourished, and in no distress.  HENT:  Head: Normocephalic and atraumatic.  Eyes: Pupils are equal, round, and reactive to light. EOM are normal.  Neck: Neck supple.  Pulmonary/Chest: Effort normal.  Neurological: She is alert and oriented to person, place, and time. Gait normal.  Skin: Skin is warm and dry.    Results for orders placed or performed in visit on 07/08/17  GC/Chlamydia Probe Amp  Result Value Ref Range   Chlamydia trachomatis, NAA Negative Negative   Neisseria gonorrhoeae by PCR Negative Negative  Urine Culture  Result Value Ref Range   Urine Culture, Routine Final report    Organism ID, Bacteria No growth   HIV antibody  Result Value Ref Range   HIV Screen 4th Generation wRfx Non Reactive Non Reactive  RPR  Result Value Ref Range   RPR Ser Ql Non Reactive Non Reactive  POCT urinalysis dipstick  Result Value Ref Range   Color,  UA yellow yellow   Clarity, UA clear clear   Glucose, UA negative negative mg/dL   Bilirubin, UA negative negative   Ketones, POC UA negative negative mg/dL   Spec Grav, UA 1.6101.025 9.6041.010 - 1.025   Blood, UA large (A) negative   pH, UA 6.0 5.0 - 8.0   Protein Ur, POC negative negative mg/dL   Urobilinogen, UA 0.2 0.2 or 1.0 E.U./dL   Nitrite, UA Negative Negative   Leukocytes, UA Negative Negative  POCT Microscopic Urinalysis ([facility])  Result Value Ref Range   WBC,UR,HPF,POC Moderate (A) None WBC/hpf   RBC,UR,HPF,POC Few (A) None RBC/hpf   Bacteria Few (A) None, Too numerous to count   Mucus Absent Absent   Epithelial Cells, UR Per Microscopy Few (A) None, Too numerous to count cells/hpf  POCT CBC    Result Value Ref Range   WBC 15.4 (A) 4.6 - 10.2 K/uL   Lymph, poc 2.4 0.6 - 3.4   POC LYMPH PERCENT 15.7 10 - 50 %L   MID (cbc) 0.9 0 - 0.9   POC MID % 5.7 0 - 12 %M   POC Granulocyte 12.1 (A) 2 - 6.9   Granulocyte percent 78.6 37 - 80 %G   RBC 4.81 4.04 - 5.48 M/uL   Hemoglobin 14.1 12.2 - 16.2 g/dL   HCT, POC 54.042.0 98.137.7 - 47.9 %   MCV 87.5 80 - 97 fL   MCH, POC 29.3 27 - 31.2 pg   MCHC 33.5 31.8 - 35.4 g/dL   RDW, POC 19.113.6 %   Platelet Count, POC 258 142 - 424 K/uL   MPV 8.5 0 - 99.8 fL  POCT Wet + KOH Prep  Result Value Ref Range   Yeast by KOH Absent Absent   Yeast by wet prep Absent Absent   WBC by wet prep Moderate (A) Few   Clue Cells Wet Prep HPF POC Few (A) None   Trich by wet prep Absent Absent   Bacteria Wet Prep HPF POC Moderate (A) Few   Epithelial Cells By Principal FinancialWet Pref ([facility]) None None, Few, Too numerous to count   RBC,UR,HPF,POC None None RBC/hpf  POCT urine pregnancy  Result Value Ref Range   Preg Test, Ur Negative Negative     ASSESSMENT and PLAN:  PID (acute pelvic inflammatory disease)   Patient responding well to empiric treatment for PID. Discussed importance of completing antibiotic course. RTC precautions discussed.      Myles LippsIrma M Santiago, MD [facility] at [facility] [address] [place], [place] [address] Ph.  [phone] Fax [phone]

## 2017-07-10 NOTE — Patient Instructions (Signed)
     IF you received an x-ray today, you will receive an invoice from [facility]. Please contact [facility] at [phone] with questions or concerns regarding your invoice.   IF you received labwork today, you will receive an invoice from [facility]. Please contact [facility] at [phone] with questions or concerns regarding your invoice.   Our billing staff will not be able to assist you with questions regarding bills from these companies.  You will be contacted with the lab results as soon as they are available. The fastest way to get your results is to activate your My Chart account. Instructions are located on the last page of this paperwork. If you have not heard from us regarding the results in 2 weeks, please contact this office.     

## 2017-07-11 ENCOUNTER — Telehealth: Payer: Self-pay

## 2017-07-11 NOTE — Telephone Encounter (Signed)
Called and left voicemail asking for patient to call back.   Reason for call - Patient is overdue for pap smear. Patient has medicaid. Resources for her: 1. [facility] [address] [phone] or [address] [phone]  2. [facility] [address] [phone]

## 2019-06-22 ENCOUNTER — Other Ambulatory Visit: Payer: Self-pay

## 2019-06-22 DIAGNOSIS — Z20822 Contact with and (suspected) exposure to covid-19: Secondary | ICD-10-CM

## 2019-06-24 LAB — NOVEL CORONAVIRUS, NAA: SARS-CoV-2, NAA: NOT DETECTED

## 2021-05-16 ENCOUNTER — Other Ambulatory Visit: Payer: Self-pay | Admitting: Obstetrics and Gynecology

## 2022-12-05 DIAGNOSIS — R300 Dysuria: Secondary | ICD-10-CM | POA: Diagnosis not present

## 2023-01-06 ENCOUNTER — Encounter: Payer: Self-pay | Admitting: Obstetrics and Gynecology

## 2023-01-06 ENCOUNTER — Ambulatory Visit: Payer: Medicaid Other | Admitting: Obstetrics and Gynecology

## 2023-01-06 ENCOUNTER — Other Ambulatory Visit (HOSPITAL_COMMUNITY)
Admission: RE | Admit: 2023-01-06 | Discharge: 2023-01-06 | Disposition: A | Discharge status: Home / Self Care | Payer: Medicaid Other | Attending: Obstetrics and Gynecology | Admitting: Obstetrics and Gynecology

## 2023-01-06 VITALS — BP 122/74 | HR 91 | Ht 63.5 in | Wt 147.0 lb

## 2023-01-06 DIAGNOSIS — N812 Incomplete uterovaginal prolapse: Secondary | ICD-10-CM | POA: Diagnosis not present

## 2023-01-06 DIAGNOSIS — N368 Other specified disorders of urethra: Secondary | ICD-10-CM

## 2023-01-06 DIAGNOSIS — R319 Hematuria, unspecified: Secondary | ICD-10-CM

## 2023-01-06 DIAGNOSIS — R350 Frequency of micturition: Secondary | ICD-10-CM | POA: Diagnosis not present

## 2023-01-06 DIAGNOSIS — N8110 Cystocele, unspecified: Secondary | ICD-10-CM

## 2023-01-06 LAB — POCT URINALYSIS DIPSTICK
Bilirubin, UA: NEGATIVE
Glucose, UA: NEGATIVE
Ketones, UA: NEGATIVE
Leukocytes, UA: NEGATIVE
Nitrite, UA: NEGATIVE
Protein, UA: NEGATIVE
Spec Grav, UA: 1.015 (ref 1.010–1.025)
Urobilinogen, UA: 0.2 E.U./dL
pH, UA: 7 (ref 5.0–8.0)

## 2023-01-06 NOTE — Progress Notes (Signed)
[facility] New Patient Evaluation and Consultation  Referring Provider: Delsa Bern, MD PCP: Delsa Bern, MD Date of Service: 01/06/2023  SUBJECTIVE Chief Complaint: New Patient (Initial Visit)  History of Present Illness: [name] [name] is a 43 y.o. White or Caucasian female seen in consultation at the request of Dr. Cletis Media for evaluation of prolapse.    Review of records significant for: Has symptoms of pelvic pain and pressure. Has Mirena IUD inserted 2022. Cystocele noted  Urinary Symptoms: Does not leak urine.   Day time voids 8.  Nocturia: 1 times per night to void. Voiding dysfunction: she empties her bladder well.  does not use a catheter to empty bladder.  When urinating, she feels the need to urinate multiple times in a row Drinks: 2-3 cups sweet tea or sprite, 2 bottles of water per day  UTIs:  0  UTI's in the last year.   Reports history of kidney or bladder stones  Pelvic Organ Prolapse Symptoms:                  Denies a feeling of a bulge the vaginal area but has pressure. It has been present for 2 years.  She Denies seeing a bulge.  This bulge is bothersome. She did some pelvic PT (3-4 times) but did not see much improvement.  She had an ultrasound recently at Dr Boyd Kerbs office which showed a cyst.   Bowel Symptom: Bowel movements: 1 time(s) per day Stool consistency: soft  Straining: yes.  Splinting: yes.  Incomplete evacuation: no.  She Denies accidental bowel leakage / fecal incontinence Bowel regimen: none   Sexual Function Sexually active: yes.  Pain with sex: deep in the pelvis Feels like she has to urinate with sex.   Pelvic Pain Denies pelvic pain   Past Medical History:  Past Medical History:  Diagnosis Date   Abnormal Pap smear 2000   COLPO X2; LAST PAP 2012   Anemia 09/23/2012   Fracture of right hand 2012   Headache(784.0)    FREQUENT   HPV (human papilloma virus) infection    Infection 2009   UTI     Kidney stones 2009     Past Surgical History:   Past Surgical History:  Procedure Laterality Date   ABDOMINOPLASTY     APPENDECTOMY  11/26/1999   DURING PREGNANCY   CHOLECYSTECTOMY     COLPOSCOPY       Past OB/GYN History: OB History  Gravida Para Term Preterm AB Living  5 4 4   1 4  $ SAB IAB Ectopic Multiple Live Births  1       4    # Outcome Date GA Lbr Len/2nd Weight Sex Delivery Anes PTL Lv  5 Term 11/27/13 41w0d12:42 / 00:19 7 lb 12 oz (3.515 kg) M Vag-Spont EPI  LIV  4 Term 09/22/12 381w2d0:48 / 00:16 7 lb 6.7 oz (3.365 kg) M Vag-Spont EPI  LIV  3 SAB 2008 8w11w0d         Birth Comments: CYTOTEC  2 Term 10/17/04 40w12w0dlb 15 oz (3.6 kg) F Vag-Spont EPI  LIV     Birth Comments: IOL; ROM  1 Term 08/01/00 40w058w0d0 7 lb 8 oz (3.402 kg) M Vag-Vacuum EPI  LIV     Birth Comments: HAD APPENDECTOMY DURING PREG    Contraception: Mirena IUD. Last pap smear was 04/2022- normal with negative HPV. Pap 04/2021 LGSIL with positive HPV.  Medications: She currently has no medications in their medication list.   Allergies: Patient has No Known Allergies.   Social History:  Social History   Tobacco Use   Smoking status: Never   Smokeless tobacco: Never  Vaping Use   Vaping Use: Never used  Substance Use Topics   Alcohol use: No    Comment: OCCASIONAL MIXED DRINK   Drug use: No    She lives with her children.   She is not employed . Regular exercise: No History of abuse: No  Family History:   Family History  Problem Relation Age of Onset   COPD Paternal Grandfather    Cancer Father    Depression Son 60       AFTER DEATH OF FATHER   Anesthesia problems Neg Hx    Other Neg Hx      Review of Systems: Review of Systems  Constitutional:  Positive for weight loss. Negative for fever and malaise/fatigue.  Respiratory:  Negative for cough, shortness of breath and wheezing.   Cardiovascular:  Positive for leg swelling. Negative for chest pain and  palpitations.  Gastrointestinal:  Negative for abdominal pain and blood in stool.  Genitourinary:  Negative for dysuria.       + vaginal discharge  Musculoskeletal:  Negative for myalgias.  Skin:  Negative for rash.  Neurological:  Negative for dizziness and headaches.  Endo/Heme/Allergies:  Does not bruise/bleed easily.  Psychiatric/Behavioral:  Negative for depression. The patient is not nervous/anxious.      OBJECTIVE Physical Exam: Vitals:   01/06/23 1508  BP: 122/74  Pulse: 91  Weight: 147 lb (66.7 kg)  Height: 5' 3.5" (1.613 m)    Physical Exam Constitutional:      General: She is not in acute distress. Pulmonary:     Effort: Pulmonary effort is normal.  Abdominal:     General: There is no distension.     Palpations: Abdomen is soft.     Tenderness: There is no abdominal tenderness. There is no rebound.  Musculoskeletal:        General: No swelling. Normal range of motion.  Skin:    General: Skin is warm and dry.     Findings: No rash.  Neurological:     Mental Status: She is alert and oriented to person, place, and time.  Psychiatric:        Mood and Affect: Mood normal.        Behavior: Behavior normal.      GU / Detailed Urogynecologic Evaluation:  Pelvic Exam: Normal external female genitalia; Bartholin's and Skene's glands normal in appearance; urethral meatus normal in appearance but palpable 1cm cystic area in suburethral space, no urethral masses or discharge.   CST: negative   Speculum exam reveals normal vaginal mucosa without atrophy. Cervix normal appearance. Uterus normal single, nontender. Adnexa no mass, fullness, tenderness.    Pelvic floor strength II/V  Pelvic floor musculature: Right levator non-tender, Right obturator non-tender, Left levator non-tender, Left obturator non-tender  POP-Q:   POP-Q  -0.5                                            Aa   -0.5  Ba  -3                                               C   5.5                                            Gh  3.5                                            Pb  8                                            tvl   -2.5                                            Ap  -2.5                                            Bp  -7                                              D      Rectal Exam:  Normal external rectum  Post-Void Residual (PVR) by Bladder Scan: In order to evaluate bladder emptying, we discussed obtaining a postvoid residual and she agreed to this procedure.  Procedure: The ultrasound unit was placed on the patient's abdomen in the suprapubic region after the patient had voided. A PVR of 57 ml was obtained by bladder scan.  Laboratory Results: POC urine: small blood  ASSESSMENT AND PLAN [name] [name] is a 42 y.o. with:  1. Suburethral cyst   2. Prolapse of anterior vaginal wall   3. Uterovaginal prolapse, incomplete   4. Urinary frequency   5. Hematuria, unspecified type    Suburethral cyst - will obtain pelvic MRI to rule out diverticulum. Maybe be contributing to bladder urgency symptoms.  - Briefly discussed that if this is a diverticulum, will need to plan for surgical excision with urethral reconstruction which will require prolonged catheter.   2. Stage II anterior, Stage I posterior, Stage I apical prolapse - For treatment of pelvic organ prolapse, we discussed options for management including expectant management, conservative management, and surgical management, such as Kegels, a pessary, pelvic floor physical therapy, and specific surgical procedures (robotic SCP vs vaginal native tissue). - She is bothered by her prolapse but is hesitant to have surgery at this time for her symptoms. She does not want a pessary. Will plan to expectantly manage at this time.   3. Urinary frequency - We discussed the symptoms of overactive bladder (OAB), which include urinary urgency, urinary frequency, nocturia,  with or without urge  incontinence.  While we do not know the exact etiology of OAB, several treatment options exist. We discussed management including behavioral therapy (decreasing bladder irritants, urge suppression strategies, timed voids, bladder retraining), physical therapy, medication.  - She will work on drinking more water and decreasing tea intake.   4. Blood in urine - Will send for micro UA and culture  Return after MRI  [name] Folds, MD   Medical Decision Making:  - Reviewed/ ordered a clinical laboratory test - Reviewed/ ordered a radiologic study - Review and summation of prior records

## 2023-01-06 NOTE — Patient Instructions (Addendum)
Today we talked about ways to manage bladder urgency such as altering your diet to avoid irritative beverages and foods (bladder diet) as well as attempting to decrease stress and other exacerbating factors.    The Most Bothersome Foods* The Least Bothersome Foods*  Coffee - Regular & Decaf Tea - caffeinated Carbonated beverages - cola, non-colas, diet & caffeine-free Alcohols - Beer, Red Wine, White Wine, Champagne Fruits - Grapefruit, Michie, Orange, [employer] - Cranberry, Grapefruit, Orange, Pineapple Vegetables - Tomato & Tomato Products Flavor Enhancers - Hot peppers, Spicy foods, Chili, Horseradish, Vinegar, Monosodium glutamate (MSG) Artificial Sweeteners - NutraSweet, Sweet 'N Low, Equal (sweetener), Saccharin Ethnic foods - [place], [place], [place] food Express Scripts - low-fat & whole Fruits - Bananas, Blueberries, Honeydew melon, Pears, Raisins, Watermelon Vegetables - Broccoli, Brussels Sprouts, [place], Carrots, Cauliflower, [place], Cucumber, Mushrooms, Peas, Radishes, Squash, Zucchini, White potatoes, Sweet potatoes & yams Poultry - Chicken, Eggs, [place], [employer] - Beef, Programmer, multimedia, Lamb Seafood - Shrimp, [place] fish, Salmon Grains - Oat, Rice Snacks - Pretzels, Popcorn  *Lissa Morales et al. Diet and its role in interstitial cystitis/bladder pain syndrome (IC/BPS) and comorbid conditions. [place] 2012 Jan 11.   You have a stage 2 (out of 4) prolapse.  We discussed the fact that it is not life threatening but there are several treatment options. For treatment of pelvic organ prolapse, we discussed options for management including expectant management, conservative management, and surgical management, such as Kegels, a pessary, pelvic floor physical therapy, and specific surgical procedures.     An MRI was ordered to look at the urethral cyst.

## 2023-01-07 LAB — URINALYSIS, ROUTINE W REFLEX MICROSCOPIC
Bilirubin Urine: NEGATIVE
Glucose, UA: NEGATIVE mg/dL
Ketones, ur: NEGATIVE mg/dL
Leukocytes,Ua: NEGATIVE
Nitrite: NEGATIVE
Protein, ur: NEGATIVE mg/dL
Specific Gravity, Urine: 1.008 (ref 1.005–1.030)
pH: 7 (ref 5.0–8.0)

## 2023-01-08 LAB — URINE CULTURE: Culture: NO GROWTH

## 2023-01-09 ENCOUNTER — Telehealth: Payer: Self-pay

## 2023-01-09 NOTE — Telephone Encounter (Signed)
Mychart msg sent. AS, CMA

## 2023-01-13 ENCOUNTER — Encounter: Payer: Self-pay | Admitting: *Deleted

## 2023-01-14 ENCOUNTER — Ambulatory Visit (HOSPITAL_COMMUNITY): Payer: Medicaid Other

## 2023-01-17 ENCOUNTER — Ambulatory Visit: Payer: Medicaid Other | Admitting: Obstetrics and Gynecology

## 2023-01-21 ENCOUNTER — Ambulatory Visit (HOSPITAL_COMMUNITY): Admission: RE | Admit: 2023-01-21 | Payer: Medicaid Other | Source: Ambulatory Visit

## 2023-01-29 ENCOUNTER — Ambulatory Visit: Payer: Medicaid Other | Admitting: Obstetrics and Gynecology

## 2023-02-01 ENCOUNTER — Ambulatory Visit (HOSPITAL_COMMUNITY)
Admission: RE | Admit: 2023-02-01 | Discharge: 2023-02-01 | Disposition: A | Discharge status: Home / Self Care | Payer: Medicaid Other | Source: Ambulatory Visit | Attending: Obstetrics and Gynecology | Admitting: Obstetrics and Gynecology

## 2023-02-01 DIAGNOSIS — N368 Other specified disorders of urethra: Secondary | ICD-10-CM | POA: Diagnosis not present

## 2023-02-01 MED ORDER — GADOBUTROL 1 MMOL/ML IV SOLN
7.0000 mL | Freq: Once | INTRAVENOUS | Status: AC | PRN
  Administered 2023-02-01: 7 mL via INTRAVENOUS

## 2023-03-05 ENCOUNTER — Ambulatory Visit (INDEPENDENT_AMBULATORY_CARE_PROVIDER_SITE_OTHER): Payer: Medicaid Other | Admitting: Obstetrics and Gynecology

## 2023-03-05 ENCOUNTER — Encounter: Payer: Self-pay | Admitting: Obstetrics and Gynecology

## 2023-03-05 VITALS — BP 119/68 | HR 94

## 2023-03-05 DIAGNOSIS — N8110 Cystocele, unspecified: Secondary | ICD-10-CM

## 2023-03-05 DIAGNOSIS — N368 Other specified disorders of urethra: Secondary | ICD-10-CM

## 2023-03-05 DIAGNOSIS — N812 Incomplete uterovaginal prolapse: Secondary | ICD-10-CM | POA: Diagnosis not present

## 2023-03-05 DIAGNOSIS — R350 Frequency of micturition: Secondary | ICD-10-CM

## 2023-03-05 MED ORDER — SOLIFENACIN SUCCINATE 5 MG PO TABS: 5.0000 mg | ORAL_TABLET | Freq: Every day | ORAL | 5 refills | Status: AC

## 2023-03-05 NOTE — Progress Notes (Signed)
[facility] Return Visit  SUBJECTIVE  History of Present Illness: [name] [name] is a 42 y.o. female seen in follow-up for prolapse and suburethral cyst.   MRI of the pelvis 02/01/23, IMPRESSION: 1. No significant abnormality visualized. Specifically, normal appearance of the urinary bladder and urethra without evidence of diverticulum or other abnormality. 2. IUD present in the fundal endometrial cavity.  She reports that she is not that bothered by her prolapse and has some concerns about undergoing surgery at this time. She is bothered by her urinary frequency and has been trying to drink less tea and soda but feels like she is always getting up to go go the bathroom.   Past Medical History: Patient  has a past medical history of Abnormal Pap smear (2000), Anemia (09/23/2012), Fracture of right hand (2012), Headache(784.0), HPV (human papilloma virus) infection, Infection (2009), and Kidney stones (2009).   Past Surgical History: She  has a past surgical history that includes Colposcopy; Appendectomy (11/26/1999); Cholecystectomy; and Abdominoplasty.   Medications: She has a current medication list which includes the following prescription(s): solifenacin.   Allergies: Patient is allergic to benadryl [diphenhydramine].   Social History: Patient  reports that she has never smoked. She has never used smokeless tobacco. She reports that she does not drink alcohol and does not use drugs.      OBJECTIVE     Physical Exam: Vitals:   03/05/23 1155  BP: 119/68  Pulse: 94   Gen: No apparent distress, A&O x 3.  Detailed Urogynecologic Evaluation:  Deferred. Prior exam showed:  POP-Q (01/06/23)   -0.5                                            Aa   -0.5                                           Ba   -3                                              C    5.5                                            Gh   3.5                                            Pb   8                                             tvl    -2.5  Ap   -2.5                                            Bp   -7                                              D        ASSESSMENT AND PLAN    [name] [name] is a 42 y.o. with:  1. Urinary frequency   2. Suburethral cyst   3. Prolapse of anterior vaginal wall   4. Uterovaginal prolapse, incomplete    Urinary frequency - will start vesicare 5mg  daily. - Discussed avoiding bladder irritants and drinking more water  2. Suburethral cyst - does not appear to be a diverticulum on MRI. Not bothersome to her so can defer excision at this time. If she decides she wants surgery for prolapse in the future and cyst is still present, can excise at that time.   3. POP - wants to expectantly manage at this time.   Return 6 weeks for medication follow up  Marguerita Beards, MD

## 2023-03-26 ENCOUNTER — Ambulatory Visit: Payer: Medicaid Other | Admitting: Obstetrics and Gynecology

## 2023-04-23 NOTE — Progress Notes (Deleted)
[facility] Return Visit  SUBJECTIVE  History of Present Illness: [name] [name] is a 42 y.o. female seen in follow-up for OAB. Plan at last visit was start Vesicare 5mg  daily.     Past Medical History: Patient  has a past medical history of Abnormal Pap smear (2000), Anemia (09/23/2012), Fracture of right hand (2012), Headache(784.0), HPV (human papilloma virus) infection, Infection (2009), and Kidney stones (2009).   Past Surgical History: She  has a past surgical history that includes Colposcopy; Appendectomy (11/26/1999); Cholecystectomy; and Abdominoplasty.   Medications: She has a current medication list which includes the following prescription(s): solifenacin.   Allergies: Patient is allergic to benadryl [diphenhydramine].   Social History: Patient  reports that she has never smoked. She has never used smokeless tobacco. She reports that she does not drink alcohol and does not use drugs.      OBJECTIVE     Physical Exam: There were no vitals filed for this visit. Gen: No apparent distress, A&O x 3.  Detailed Urogynecologic Evaluation:  Deferred. Prior exam showed:      No data to display             ASSESSMENT AND PLAN    [name] is a 42 y.o. with:  No diagnosis found.

## 2023-04-24 ENCOUNTER — Ambulatory Visit: Payer: Medicaid Other | Admitting: Obstetrics and Gynecology

## 2023-05-01 DIAGNOSIS — R051 Acute cough: Secondary | ICD-10-CM | POA: Diagnosis not present

## 2023-05-01 DIAGNOSIS — H6123 Impacted cerumen, bilateral: Secondary | ICD-10-CM | POA: Diagnosis not present

## 2023-05-01 DIAGNOSIS — J029 Acute pharyngitis, unspecified: Secondary | ICD-10-CM | POA: Diagnosis not present

## 2023-12-16 ENCOUNTER — Other Ambulatory Visit: Payer: Self-pay | Admitting: Obstetrics and Gynecology

## 2023-12-16 DIAGNOSIS — Z1330 Encounter for screening examination for mental health and behavioral disorders, unspecified: Secondary | ICD-10-CM | POA: Diagnosis not present

## 2023-12-16 DIAGNOSIS — R87612 Low grade squamous intraepithelial lesion on cytologic smear of cervix (LGSIL): Secondary | ICD-10-CM | POA: Diagnosis not present

## 2023-12-16 DIAGNOSIS — L293 Anogenital pruritus, unspecified: Secondary | ICD-10-CM | POA: Diagnosis not present

## 2023-12-16 DIAGNOSIS — N8110 Cystocele, unspecified: Secondary | ICD-10-CM | POA: Diagnosis not present

## 2023-12-16 DIAGNOSIS — Z1239 Encounter for other screening for malignant neoplasm of breast: Secondary | ICD-10-CM | POA: Diagnosis not present

## 2023-12-16 DIAGNOSIS — Z01419 Encounter for gynecological examination (general) (routine) without abnormal findings: Secondary | ICD-10-CM | POA: Diagnosis not present

## 2023-12-16 DIAGNOSIS — N814 Uterovaginal prolapse, unspecified: Secondary | ICD-10-CM | POA: Diagnosis not present

## 2023-12-16 DIAGNOSIS — N760 Acute vaginitis: Secondary | ICD-10-CM | POA: Diagnosis not present

## 2023-12-16 DIAGNOSIS — N368 Other specified disorders of urethra: Secondary | ICD-10-CM | POA: Diagnosis not present

## 2023-12-16 DIAGNOSIS — I83893 Varicose veins of bilateral lower extremities with other complications: Secondary | ICD-10-CM | POA: Diagnosis not present

## 2023-12-16 DIAGNOSIS — Z3040 Encounter for surveillance of contraceptives, unspecified: Secondary | ICD-10-CM | POA: Diagnosis not present

## 2023-12-16 DIAGNOSIS — Z1231 Encounter for screening mammogram for malignant neoplasm of breast: Secondary | ICD-10-CM

## 2023-12-16 DIAGNOSIS — Z6829 Body mass index (BMI) 29.0-29.9, adult: Secondary | ICD-10-CM | POA: Diagnosis not present

## 2023-12-19 ENCOUNTER — Encounter: Payer: Self-pay | Admitting: Obstetrics and Gynecology

## 2023-12-31 ENCOUNTER — Ambulatory Visit
Admission: RE | Admit: 2023-12-31 | Discharge: 2023-12-31 | Disposition: A | Discharge status: Home / Self Care | Payer: Medicaid Other | Source: Ambulatory Visit | Attending: Obstetrics and Gynecology | Admitting: Obstetrics and Gynecology

## 2023-12-31 DIAGNOSIS — Z1231 Encounter for screening mammogram for malignant neoplasm of breast: Secondary | ICD-10-CM

## 2024-01-06 ENCOUNTER — Other Ambulatory Visit: Payer: Self-pay | Admitting: Obstetrics and Gynecology

## 2024-01-06 DIAGNOSIS — R928 Other abnormal and inconclusive findings on diagnostic imaging of breast: Secondary | ICD-10-CM

## 2024-01-15 ENCOUNTER — Ambulatory Visit
Admission: RE | Admit: 2024-01-15 | Discharge: 2024-01-15 | Disposition: A | Discharge status: Home / Self Care | Payer: Medicaid Other | Source: Ambulatory Visit | Attending: Obstetrics and Gynecology | Admitting: Obstetrics and Gynecology

## 2024-01-15 DIAGNOSIS — R928 Other abnormal and inconclusive findings on diagnostic imaging of breast: Secondary | ICD-10-CM

## 2024-01-15 DIAGNOSIS — N6002 Solitary cyst of left breast: Secondary | ICD-10-CM | POA: Diagnosis not present

## 2024-01-15 DIAGNOSIS — N6320 Unspecified lump in the left breast, unspecified quadrant: Secondary | ICD-10-CM | POA: Diagnosis not present

## 2024-01-20 DIAGNOSIS — I87393 Chronic venous hypertension (idiopathic) with other complications of bilateral lower extremity: Secondary | ICD-10-CM | POA: Diagnosis not present

## 2024-01-20 DIAGNOSIS — M79662 Pain in left lower leg: Secondary | ICD-10-CM | POA: Diagnosis not present

## 2024-01-20 DIAGNOSIS — R252 Cramp and spasm: Secondary | ICD-10-CM | POA: Diagnosis not present

## 2024-01-20 DIAGNOSIS — M79661 Pain in right lower leg: Secondary | ICD-10-CM | POA: Diagnosis not present

## 2024-02-24 DIAGNOSIS — N760 Acute vaginitis: Secondary | ICD-10-CM | POA: Diagnosis not present

## 2024-02-24 DIAGNOSIS — L293 Anogenital pruritus, unspecified: Secondary | ICD-10-CM | POA: Diagnosis not present

## 2024-12-03 ENCOUNTER — Other Ambulatory Visit: Payer: Self-pay | Admitting: Obstetrics and Gynecology

## 2024-12-03 DIAGNOSIS — Z1231 Encounter for screening mammogram for malignant neoplasm of breast: Secondary | ICD-10-CM

## 2025-01-03 ENCOUNTER — Ambulatory Visit
